# Patient Record
Sex: Male | Born: 1940
Health system: Southern US, Community
[De-identification: ages and names within clinical notes are randomized; demographics above are authoritative.]

## PROBLEM LIST (undated history)

## (undated) DIAGNOSIS — E039 Hypothyroidism, unspecified: Secondary | ICD-10-CM

## (undated) DIAGNOSIS — E785 Hyperlipidemia, unspecified: Secondary | ICD-10-CM

## (undated) DIAGNOSIS — Z860100 Personal history of colon polyps, unspecified: Secondary | ICD-10-CM

## (undated) DIAGNOSIS — D649 Anemia, unspecified: Secondary | ICD-10-CM

## (undated) DIAGNOSIS — C8510 Unspecified B-cell lymphoma, unspecified site: Secondary | ICD-10-CM

## (undated) DIAGNOSIS — N390 Urinary tract infection, site not specified: Secondary | ICD-10-CM

## (undated) DIAGNOSIS — M1990 Unspecified osteoarthritis, unspecified site: Secondary | ICD-10-CM

## (undated) DIAGNOSIS — M1712 Unilateral primary osteoarthritis, left knee: Secondary | ICD-10-CM

## (undated) DIAGNOSIS — M810 Age-related osteoporosis without current pathological fracture: Secondary | ICD-10-CM

## (undated) DIAGNOSIS — H548 Legal blindness, as defined in USA: Secondary | ICD-10-CM

## (undated) DIAGNOSIS — J45909 Unspecified asthma, uncomplicated: Secondary | ICD-10-CM

## (undated) DIAGNOSIS — T7840XA Allergy, unspecified, initial encounter: Secondary | ICD-10-CM

## (undated) DIAGNOSIS — J439 Emphysema, unspecified: Secondary | ICD-10-CM

## (undated) DIAGNOSIS — F419 Anxiety disorder, unspecified: Secondary | ICD-10-CM

## (undated) HISTORY — DX: Legal blindness, as defined in USA: H54.8

## (undated) HISTORY — DX: Urinary tract infection, site not specified: N39.0

## (undated) HISTORY — DX: Unspecified osteoarthritis, unspecified site: M19.90

## (undated) HISTORY — DX: Personal history of colon polyps, unspecified: Z86.0100

## (undated) HISTORY — DX: Personal history of colonic polyps: Z86.010

## (undated) HISTORY — DX: Allergy, unspecified, initial encounter: T78.40XA

## (undated) HISTORY — PX: EYE SURGERY: SHX253

## (undated) HISTORY — DX: Age-related osteoporosis without current pathological fracture: M81.0

## (undated) HISTORY — DX: Anemia, unspecified: D64.9

## (undated) HISTORY — DX: Unspecified asthma, uncomplicated: J45.909

## (undated) HISTORY — DX: Hyperlipidemia, unspecified: E78.5

## (undated) HISTORY — DX: Unspecified B-cell lymphoma, unspecified site: C85.10

## (undated) HISTORY — DX: Emphysema, unspecified: J43.9

---
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
## 1980-05-29 HISTORY — PX: EYE SURGERY: SHX253

## 1997-05-29 HISTORY — PX: COLON SURGERY: SHX602

## 1997-05-29 HISTORY — PX: APPENDECTOMY: SHX54

## 2003-05-30 DIAGNOSIS — C8510 Unspecified B-cell lymphoma, unspecified site: Secondary | ICD-10-CM

## 2003-05-30 HISTORY — DX: Unspecified B-cell lymphoma, unspecified site: C85.10

## 2007-05-30 HISTORY — PX: PROSTATE ABLATION: SHX6042

## 2008-12-27 HISTORY — PX: BRAIN SURGERY: SHX531

## 2009-05-29 HISTORY — PX: KNEE ARTHROSCOPY: SUR90

## 2009-06-24 ENCOUNTER — Encounter: Admission: RE | Admit: 2009-06-24 | Discharge: 2009-07-27 | Payer: Self-pay | Admitting: Orthopedic Surgery

## 2012-11-02 DIAGNOSIS — J45909 Unspecified asthma, uncomplicated: Secondary | ICD-10-CM | POA: Insufficient documentation

## 2012-11-02 DIAGNOSIS — E7800 Pure hypercholesterolemia, unspecified: Secondary | ICD-10-CM | POA: Insufficient documentation

## 2014-08-14 ENCOUNTER — Other Ambulatory Visit (HOSPITAL_COMMUNITY): Payer: Self-pay | Admitting: Urology

## 2014-08-14 DIAGNOSIS — N2889 Other specified disorders of kidney and ureter: Secondary | ICD-10-CM

## 2014-08-18 ENCOUNTER — Ambulatory Visit (HOSPITAL_COMMUNITY)
Admission: RE | Admit: 2014-08-18 | Discharge: 2014-08-18 | Disposition: A | Discharge status: Home / Self Care | Payer: Medicare HMO | Source: Ambulatory Visit | Attending: Urology | Admitting: Urology

## 2014-08-18 DIAGNOSIS — N400 Enlarged prostate without lower urinary tract symptoms: Secondary | ICD-10-CM | POA: Diagnosis not present

## 2014-08-18 DIAGNOSIS — N2889 Other specified disorders of kidney and ureter: Secondary | ICD-10-CM

## 2015-08-25 DIAGNOSIS — Z800 Family history of malignant neoplasm of digestive organs: Secondary | ICD-10-CM | POA: Insufficient documentation

## 2015-09-01 LAB — HM COLONOSCOPY

## 2015-09-03 DIAGNOSIS — C8510 Unspecified B-cell lymphoma, unspecified site: Secondary | ICD-10-CM | POA: Insufficient documentation

## 2016-08-17 ENCOUNTER — Other Ambulatory Visit: Payer: Self-pay | Admitting: Urology

## 2016-08-17 DIAGNOSIS — R972 Elevated prostate specific antigen [PSA]: Secondary | ICD-10-CM

## 2016-09-01 ENCOUNTER — Ambulatory Visit
Admission: RE | Admit: 2016-09-01 | Discharge: 2016-09-01 | Disposition: A | Discharge status: Home / Self Care | Payer: Medicare HMO | Source: Ambulatory Visit | Attending: Urology | Admitting: Urology

## 2016-09-01 DIAGNOSIS — R972 Elevated prostate specific antigen [PSA]: Secondary | ICD-10-CM

## 2016-09-01 MED ORDER — GADOBENATE DIMEGLUMINE 529 MG/ML IV SOLN
15.0000 mL | Freq: Once | INTRAVENOUS | Status: AC | PRN
  Administered 2016-09-01: 14 mL via INTRAVENOUS

## 2016-10-27 LAB — PSA: PSA: 3.21

## 2017-03-30 LAB — PSA: PSA: 4.23

## 2017-09-21 IMAGING — MR MR PROSTATE WO/W CM
56 series · 56 of 56 positions shown · IV contrast (Multihance 14ml)
Comparison: None.

CLINICAL DATA: Elevated PSA level. Previous negative prostate
biopsy.

Creatinine was obtained on site at [HOSPITAL] at [HOSPITAL].Results: Creatinine 1.1 mg/dL.
EXAM:
MR PROSTATE WITHOUT AND WITH CONTRAST
TECHNIQUE: Multiplanar multisequence MRI images were obtained of the pelvis
centered about the prostate. Pre and post contrast images were
obtained.
CONTRAST:  14mL MULTIHANCE GADOBENATE DIMEGLUMINE 529 MG/ML IV SOLN

[Series 3: T1 · axial · 8.0mm · 1.06mm/px · 1 of 28 slices shown (1 of 2)]
[im 1/28]
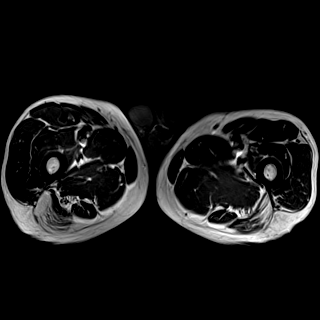

[Series 4: bSSFP fat-sat · axial · 8.0mm · 0.74mm/px · 1 of 28 slices shown]
[im 1/28]
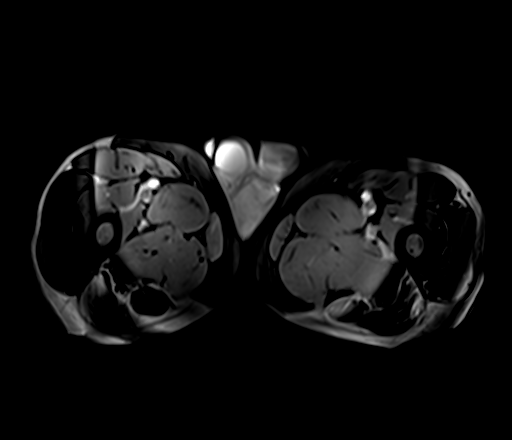

[Series 5: T2 · sagittal · 3.5mm · 0.56mm/px · 1 of 40 slices shown (1 of 4)]
[im 1/40]
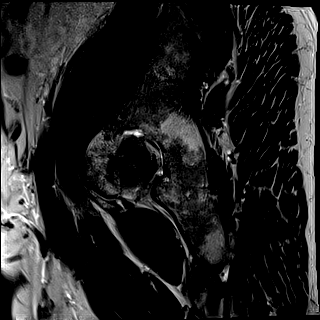

[Series 6: T1 · axial · 3.0mm · 0.31mm/px · 1 of 24 slices shown (2 of 2)]
[im 1/24]
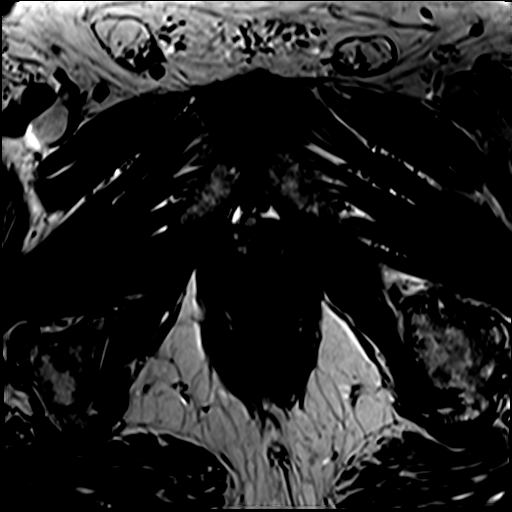

[Series 7: T2 · axial · 3.5mm · 0.56mm/px · 1 of 23 slices shown (2 of 4)]
[im 1/23]
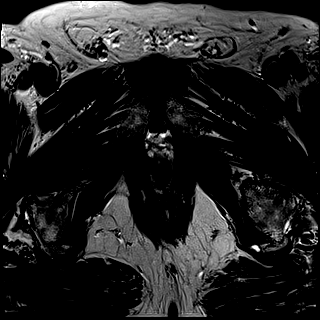

[Series 8: T2 · axial · 1.0mm · 1.04mm/px · 1 of 80 slices shown (3 of 4)]
[im 1/80]
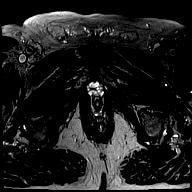

[Series 9: T2 · coronal · 3.5mm · 0.56mm/px · 1 of 24 slices shown (4 of 4)]
[im 1/24]
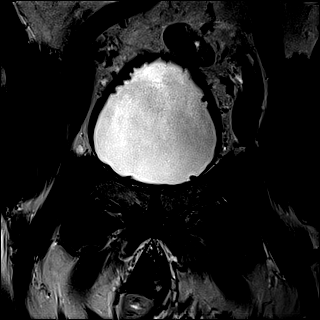

[Series 10: DWI · axial · 3.5mm · 1.56mm/px · 1 of 60 slices shown (1 of 2)]
[im 1/60]
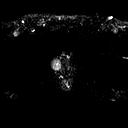

[Series 11: DWI · axial · 3.5mm · 1.56mm/px · 1 of 20 slices shown (2 of 2)]
[im 1/20]
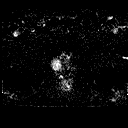

[Series 12: t1_twist_tra_dyn_ttc=5.8s · axial · 3.5mm · 0.83mm/px · 1 of 22 slices shown]
[im 1/22]
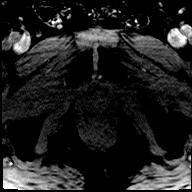

[Series 13: t1_twist_tra_dyn-copy center to · axial · 3.5mm · 0.83mm/px · 1 of 22 slices shown (1 of 24)]
[im 1/22]
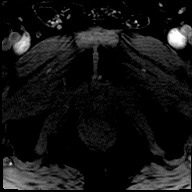

[Series 14: t1_twist_tra_dyn-copy center to · axial · 3.5mm · 0.83mm/px · 1 of 22 slices shown (2 of 24)]
[im 1/22]
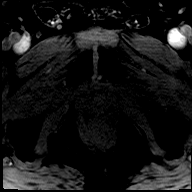

[Series 15: t1_twist_tra_dyn-copy center to_sub_ttc=25.7s · axial · 3.5mm · 0.83mm/px · 1 of 22 slices shown]
[im 1/22]
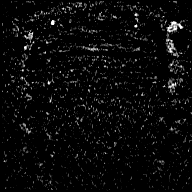

[Series 16: t1_twist_tra_dyn-copy center to · axial · 3.5mm · 0.83mm/px · 1 of 22 slices shown (3 of 24)]
[im 1/22]
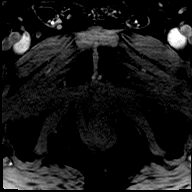

[Series 17: t1_twist_tra_dyn-copy center to_sub_ttc=37.2s · axial · 3.5mm · 0.83mm/px · 1 of 22 slices shown]
[im 1/22]
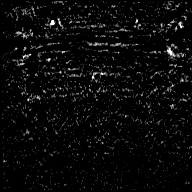

[Series 18: t1_twist_tra_dyn-copy center to · axial · 3.5mm · 0.83mm/px · 1 of 22 slices shown (4 of 24)]
[im 1/22]
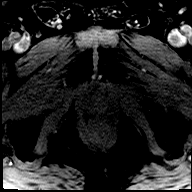

[Series 19: t1_twist_tra_dyn-copy center to_sub_ttc=48.6s · axial · 3.5mm · 0.83mm/px · 1 of 22 slices shown]
[im 1/22]
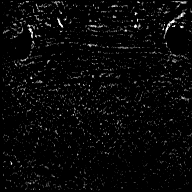

[Series 20: t1_twist_tra_dyn-copy center to · axial · 3.5mm · 0.83mm/px · 1 of 22 slices shown (5 of 24)]
[im 1/22]
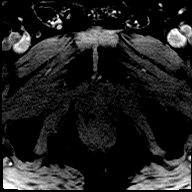

[Series 21: t1_twist_tra_dyn-copy center to_sub_ttc=60.1s · axial · 3.5mm · 0.83mm/px · 1 of 22 slices shown]
[im 1/22]
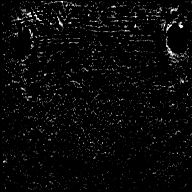

[Series 22: t1_twist_tra_dyn-copy center to · axial · 3.5mm · 0.83mm/px · 1 of 22 slices shown (6 of 24)]
[im 1/22]
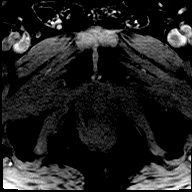

[Series 23: t1_twist_tra_dyn-copy center to_sub_ttc=71.6s · axial · 3.5mm · 0.83mm/px · 1 of 22 slices shown]
[im 1/22]
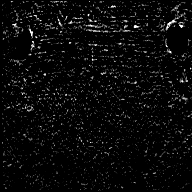

[Series 24: t1_twist_tra_dyn-copy center to · axial · 3.5mm · 0.83mm/px · 1 of 22 slices shown (7 of 24)]
[im 1/22]
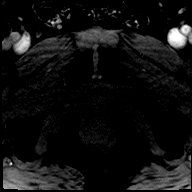

[Series 25: t1_twist_tra_dyn-copy center to_sub_ttc=83.1s · axial · 3.5mm · 0.83mm/px · 1 of 22 slices shown]
[im 1/22]
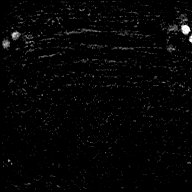

[Series 26: t1_twist_tra_dyn-copy center to · axial · 3.5mm · 0.83mm/px · 1 of 22 slices shown (8 of 24)]
[im 1/22]
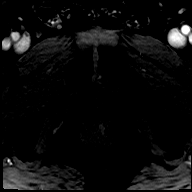

[Series 27: t1_twist_tra_dyn-copy center to_sub_ttc=94.5s · axial · 3.5mm · 0.83mm/px · 1 of 22 slices shown]
[im 1/22]
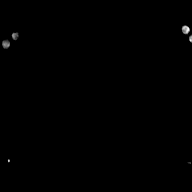

[Series 28: t1_twist_tra_dyn-copy center to · axial · 3.5mm · 0.83mm/px · 1 of 22 slices shown (9 of 24)]
[im 1/22]
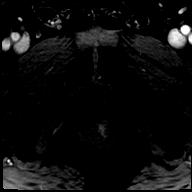

[Series 29: t1_twist_tra_dyn-copy center to_sub_ttc=106.0s · axial · 3.5mm · 0.83mm/px · 1 of 22 slices shown]
[im 1/22]
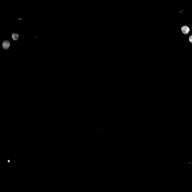

[Series 30: t1_twist_tra_dyn-copy center to · axial · 3.5mm · 0.83mm/px · 1 of 22 slices shown (10 of 24)]
[im 1/22]
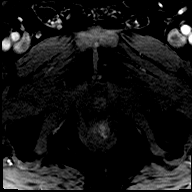

[Series 31: t1_twist_tra_dyn-copy center to_sub_ttc=117.5s · axial · 3.5mm · 0.83mm/px · 1 of 22 slices shown]
[im 1/22]
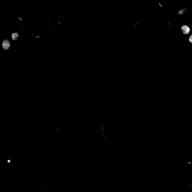

[Series 32: t1_twist_tra_dyn-copy center to · axial · 3.5mm · 0.83mm/px · 1 of 22 slices shown (11 of 24)]
[im 1/22]
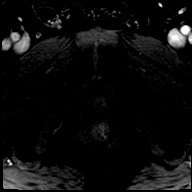

[Series 33: t1_twist_tra_dyn-copy center to_sub_ttc=128.9s · axial · 3.5mm · 0.83mm/px · 1 of 22 slices shown]
[im 1/22]
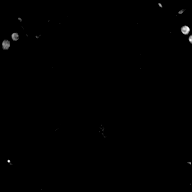

[Series 34: t1_twist_tra_dyn-copy center to · axial · 3.5mm · 0.83mm/px · 1 of 22 slices shown (12 of 24)]
[im 1/22]
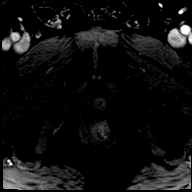

[Series 35: t1_twist_tra_dyn-copy center to_sub_ttc=140.4s · axial · 3.5mm · 0.83mm/px · 1 of 22 slices shown]
[im 1/22]
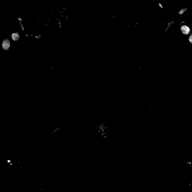

[Series 36: t1_twist_tra_dyn-copy center to · axial · 3.5mm · 0.83mm/px · 1 of 22 slices shown (13 of 24)]
[im 1/22]
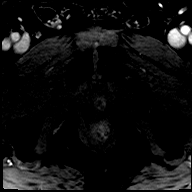

[Series 37: t1_twist_tra_dyn-copy center to_sub_ttc=151.9s · axial · 3.5mm · 0.83mm/px · 1 of 22 slices shown]
[im 1/22]
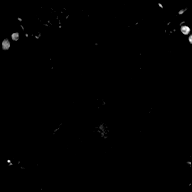

[Series 38: t1_twist_tra_dyn-copy center to · axial · 3.5mm · 0.83mm/px · 1 of 22 slices shown (14 of 24)]
[im 1/22]
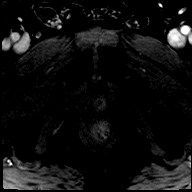

[Series 39: t1_twist_tra_dyn-copy center to_sub_ttc=163.3s · axial · 3.5mm · 0.83mm/px · 1 of 22 slices shown]
[im 1/22]
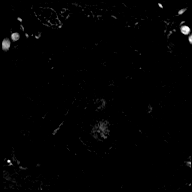

[Series 40: t1_twist_tra_dyn-copy center to · axial · 3.5mm · 0.83mm/px · 1 of 22 slices shown (15 of 24)]
[im 1/22]
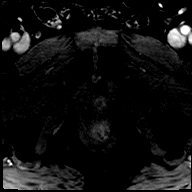

[Series 41: t1_twist_tra_dyn-copy center to_sub_ttc=174.8s · axial · 3.5mm · 0.83mm/px · 1 of 22 slices shown]
[im 1/22]
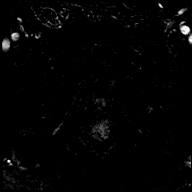

[Series 42: t1_twist_tra_dyn-copy center to · axial · 3.5mm · 0.83mm/px · 1 of 22 slices shown (16 of 24)]
[im 1/22]
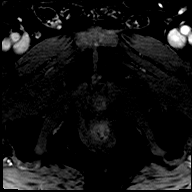

[Series 43: t1_twist_tra_dyn-copy center to_sub_ttc=186.3s · axial · 3.5mm · 0.83mm/px · 1 of 22 slices shown]
[im 1/22]
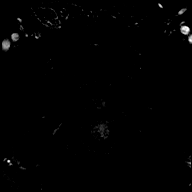

[Series 44: t1_twist_tra_dyn-copy center to · axial · 3.5mm · 0.83mm/px · 1 of 22 slices shown (17 of 24)]
[im 1/22]
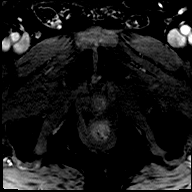

[Series 45: t1_twist_tra_dyn-copy center to_sub_ttc=197.7s · axial · 3.5mm · 0.83mm/px · 1 of 22 slices shown]
[im 1/22]
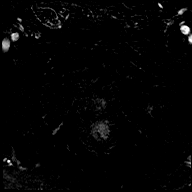

[Series 46: t1_twist_tra_dyn-copy center to · axial · 3.5mm · 0.83mm/px · 1 of 22 slices shown (18 of 24)]
[im 1/22]
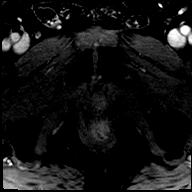

[Series 47: t1_twist_tra_dyn-copy center to_sub_ttc=209.2s · axial · 3.5mm · 0.83mm/px · 1 of 22 slices shown]
[im 1/22]
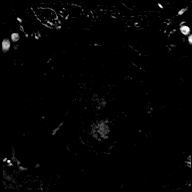

[Series 48: t1_twist_tra_dyn-copy center to · axial · 3.5mm · 0.83mm/px · 1 of 22 slices shown (19 of 24)]
[im 1/22]
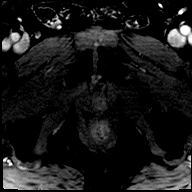

[Series 49: t1_twist_tra_dyn-copy center to_sub_ttc=220.7s · axial · 3.5mm · 0.83mm/px · 1 of 22 slices shown]
[im 1/22]
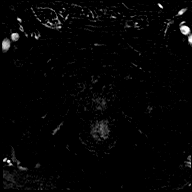

[Series 50: t1_twist_tra_dyn-copy center to · axial · 3.5mm · 0.83mm/px · 1 of 22 slices shown (20 of 24)]
[im 1/22]
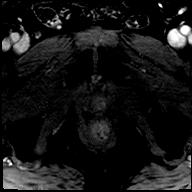

[Series 51: t1_twist_tra_dyn-copy center to_sub_ttc=232.2s · axial · 3.5mm · 0.83mm/px · 1 of 22 slices shown]
[im 1/22]
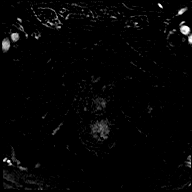

[Series 52: t1_twist_tra_dyn-copy center to · axial · 3.5mm · 0.83mm/px · 1 of 22 slices shown (21 of 24)]
[im 1/22]
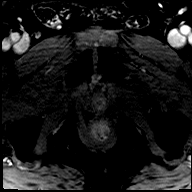

[Series 53: t1_twist_tra_dyn-copy center to_sub_ttc=243.6s · axial · 3.5mm · 0.83mm/px · 1 of 22 slices shown]
[im 1/22]
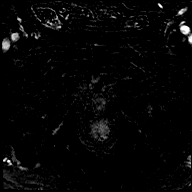

[Series 54: t1_twist_tra_dyn-copy center to · axial · 3.5mm · 0.83mm/px · 1 of 22 slices shown (22 of 24)]
[im 1/22]
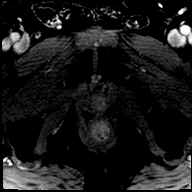

[Series 55: t1_twist_tra_dyn-copy center to_sub_ttc=255.1s · axial · 3.5mm · 0.83mm/px · 1 of 22 slices shown]
[im 1/22]
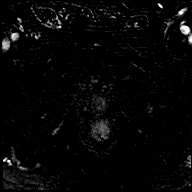

[Series 56: t1_twist_tra_dyn-copy center to · axial · 3.5mm · 0.83mm/px · 1 of 22 slices shown (23 of 24)]
[im 1/22]
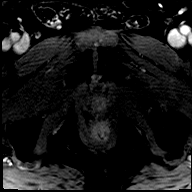

[Series 57: t1_twist_tra_dyn-copy center to_sub_ttc=266.6s · axial · 3.5mm · 0.83mm/px · 1 of 22 slices shown]
[im 1/22]
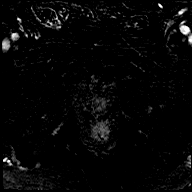

[Series 58: t1_twist_tra_dyn-copy center to · axial · 3.5mm · 0.83mm/px · 1 of 22 slices shown (24 of 24)]
[im 1/22]
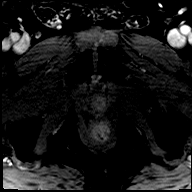

[56 of 56 positions shown; findings below may reference images not displayed]

FINDINGS: Prostate: Transition zone hypertrophy is seen with multiple BPH
nodules. There is mass effect on the bladder base. No suspicious T2
hypointense transition zone nodules are identified.

The peripheral zone shows diffuse thinning. Extruded BPH nodules are
seen in the right anterior apex and right posterolateral mid gland.
No focal areas of ADC hypointensity or DWI hyperintensity are seen
within the peripheral zone. No focal sites of early enhancement in
peripheral zone on dynamic imaging.

Volume: 7.5 x 6.4 x 7.9 cm (volume = 200 cm^3)

Transcapsular spread:  Absent

Seminal vesicle involvement: Absent

Neurovascular bundle involvement: Absent

Pelvic adenopathy: Absent

Bone metastasis: Absent

Other findings: Mild diffuse urinary bladder wall thickening,
consistent with chronic bladder outlet obstruction. A small septated
cystic lesion is seen adjacent to the right external iliac vessels
and right iliopsoas muscle which measures 2.5 x 1.8 cm on image [DATE].
This shows no evidence of contrast enhancement, and is consistent
with a benign lymphangioma, lymphocele, or seroma.
IMPRESSION: No radiographic evidence of high-grade prostate carcinoma. PI-RADS
1: Very Low (clinically significant cancer is highly unlikely to be
present)

## 2017-11-09 ENCOUNTER — Telehealth: Payer: Self-pay

## 2017-11-09 NOTE — Telephone Encounter (Signed)
Please schedule at his earliest convenience

## 2017-11-09 NOTE — Telephone Encounter (Signed)
Called pt and LVM regarding their request to establish care with Dr. Larose Kells. Informed him that his request was approved and to call and schedule a NP appt.

## 2017-11-09 NOTE — Telephone Encounter (Signed)
-----   Message from [place] sent at 11/09/2017  9:02 AM EDT ----- Regarding: New Pt to est Contact: [phone] Pt would like to know if possible to be established as a new pt, Pt was recommended by Ananias Pilgrim (a pt that already sees Dr Larose Kells) Please Advise.

## 2017-11-12 NOTE — Telephone Encounter (Signed)
Appt scheduled 12/14/2017.

## 2017-12-14 ENCOUNTER — Encounter: Payer: Self-pay | Admitting: Internal Medicine

## 2017-12-14 ENCOUNTER — Encounter (INDEPENDENT_AMBULATORY_CARE_PROVIDER_SITE_OTHER): Payer: Self-pay

## 2017-12-14 ENCOUNTER — Ambulatory Visit (INDEPENDENT_AMBULATORY_CARE_PROVIDER_SITE_OTHER): Payer: Medicare HMO | Admitting: Internal Medicine

## 2017-12-14 VITALS — BP 124/68 | HR 77 | Temp 97.7°F | Resp 16 | Ht 73.0 in | Wt 162.1 lb

## 2017-12-14 DIAGNOSIS — J4520 Mild intermittent asthma, uncomplicated: Secondary | ICD-10-CM | POA: Diagnosis not present

## 2017-12-14 DIAGNOSIS — E7800 Pure hypercholesterolemia, unspecified: Secondary | ICD-10-CM

## 2017-12-14 DIAGNOSIS — N400 Enlarged prostate without lower urinary tract symptoms: Secondary | ICD-10-CM

## 2017-12-14 DIAGNOSIS — E079 Disorder of thyroid, unspecified: Secondary | ICD-10-CM

## 2017-12-14 DIAGNOSIS — M150 Primary generalized (osteo)arthritis: Secondary | ICD-10-CM

## 2017-12-14 DIAGNOSIS — M159 Polyosteoarthritis, unspecified: Secondary | ICD-10-CM

## 2017-12-14 DIAGNOSIS — R972 Elevated prostate specific antigen [PSA]: Secondary | ICD-10-CM

## 2017-12-14 NOTE — Progress Notes (Signed)
Subjective:    Patient ID: Kyle Wise, male    DOB: [birth year], 77 y.o.   MRN: [id number]  DOS:  12/14/2017 Type of visit - description : New patient, referred by Bennie Dallas. Interval history:  New patient, no major concerns reported today. The patient brought information for my review. Also reviewed  available information in two electronic medical record systems  Review of Systems  Denies chest pain or difficulty breathing No nausea, vomiting, diarrhea.  No blood in the stools. LUTS relatively well controlled as long as he takes tamsulosin.  Still has nocturia few times every night. Asthma not an issue at this point, no cough, wheezing.  Past Medical History:  Diagnosis Date  . Allergy    seasonal  . Anemia   . Arthritis   . Asthma   . Emphysema of lung ([place])   . Frequent UTI   . History of colon polyps   . Hyperlipidemia   . Legal blindness   . 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 Osteoporosis     Past Surgical History:  Procedure Laterality Date  . APPENDECTOMY  1999  . BRAIN SURGERY  12/2008   meningioma   . COLON SURGERY  1999   colon resection  . EYE SURGERY     cataract  . KNEE ARTHROSCOPY Left 05/2009  . PROSTATE ABLATION  2009   "microwave procedure", Dr Hazle Nordmann  . TONSILLECTOMY AND ADENOIDECTOMY  1945    Social History   Socioeconomic History  . Marital status: Married    Spouse name: Not on file  . Number of children: 2  . Years of education: Not on file  . Highest education level: Not on file  Occupational History  . Occupation: retiredProgramme researcher, broadcasting/film/video for [employer]  Social Needs  . Financial resource strain: Not on file  . Food insecurity:    Worry: Not on file    Inability: Not on file  . Transportation needs:    Medical: Not on file    Non-medical: Not on file  Tobacco Use  . Smoking status: Never Smoker  . Smokeless tobacco: Never Used  Substance and Sexual Activity  . Alcohol use: Yes    Comment: socially  . Drug use: Never  .  Sexual activity: Not on file  Lifestyle  . Physical activity:    Days per week: Not on file    Minutes per session: Not on file  . Stress: Not on file  Relationships  . Social connections:    Talks on phone: Not on file    Gets together: Not on file    Attends religious service: Not on file    Active member of club or organization: Not on file    Attends meetings of clubs or organizations: Not on file    Relationship status: Not on file  . Intimate partner violence:    Fear of current or ex partner: Not on file    Emotionally abused: Not on file    Physically abused: Not on file    Forced sexual activity: Not on file  Other Topics Concern  . Not on file  Social History Narrative   Household: Pt and wife     Family History  Problem Relation Age of Onset  . CAD Mother        MI age 61  . Arthritis Mother   . Heart attack Mother   . Cancer Mother   . Colon cancer Father   . Arthritis  Father   . Diabetes Maternal Grandmother   . CAD Maternal Grandfather   . Heart attack Maternal Grandfather   . Asthma Brother   . Arthritis Brother   . Cancer Paternal Grandmother   . Prostate cancer Neg Hx      Allergies as of 12/14/2017   No Known Allergies     Medication List        Accurate as of 12/14/17 11:59 PM. Always use your most recent med list.          aspirin EC 81 MG tablet Take 81 mg by mouth daily.   liothyronine 5 MCG tablet Commonly known as:  CYTOMEL Take 5 mcg by mouth daily.   Magnesium 200 MG Tabs Take 200 mg by mouth 2 (two) times daily.   Melatonin 1 MG Caps Take 1 mg by mouth daily.   multivitamin with minerals Tabs tablet Take 1 tablet by mouth daily.   NALTREXONE HCL PO Take 4.5 mg by mouth daily.   tamsulosin 0.4 MG Caps capsule Commonly known as:  FLOMAX Take 0.4 mg by mouth 2 (two) times daily.   thyroid 60 MG tablet Commonly known as:  ARMOUR Take 60 mg by mouth daily.          Objective:   Physical Exam BP 124/68 (BP  Location: Left Arm, Patient Position: Sitting, Cuff Size: Small)   Pulse 77   Temp 97.7 F (36.5 C) (Oral)   Resp 16   Ht 6\' 1"  (1.854 m)   Wt 162 lb 2 oz (73.5 kg)   SpO2 98%   BMI 21.39 kg/m  General: Well developed, NAD, see BMI.  Neck: No  thyromegaly  HEENT:  Normocephalic . Face symmetric, atraumatic Lungs:  Decreased breath sounds Normal respiratory effort, no intercostal retractions, no accessory muscle use. Heart: RRR,  no murmur.  No pretibial edema bilaterally  Abdomen:  Not distended, soft, non-tender. No rebound or rigidity.   Skin: Exposed areas without rash. Not pale. Not jaundice Neurologic:  alert & oriented X3.  Speech normal, gait appropriate for age and unassisted Strength symmetric and appropriate for age.  Psych: Cognition and judgment appear intact.  Cooperative with normal attention span and concentration.  Behavior appropriate. No anxious or depressed appearing.     Assessment & Plan:   Assessment Hyperglycemia High cholesterol Thyroid disease: on citomel/armour rx by   Kirk Ruths NP Asthma DJD, history of knee arthroscopy 2011 Low-grade B-cell lymphoma, hematology [facility] Elevated PSA, BPH, LUTS.  Prostate MRI 08/2016 with no evidence of high-grade prostate cancer. Dr Alinda Money Legally blind due to meningioma surgery Colon polyp: s/p partial colectomy 1999, Dr Dorrene German Naltroxen: rx by Kirk Ruths NP [place] NP (holistic NP)  PLAN:  Labs from 07/17/2017: A1c 5.7.  Uric acid 8.7. CMP: Creatinine 1.1.  Albumin 5.0, slightly low. Ferritin normal 120. Total cholesterol 231, LDL 173, HDL 42.  Triglycerides 81 C-reactive protein: 3.4, slightly high. TSH 0.9, free T3 reverse T3 30, slightly elevated; T4 normal Vitamin D normal CBC platelets 149, hemoglobin 17.5  High cholesterol: Diet controlled, recent LDL 173, recheck on RTC Asthma: Not an issue at this time Thyroid disease: On Cytomel and Armour.  Unclear reason to take Cytomel.  Pt declined   endocrinology referral. DJD: Mostly at the knee, conservative treatment for now Lymphoma: Follow-up @ [facility]. BPH: Sees Dr. Alinda Money, will get records Naltrexone: Prescribed for cancer prevention, denies issues with abuse.  Prescribed elsewhere, pt aware I won't RF meds this  meds Off-label use of meds: pt aware I won't RF RTC fasting 4 months  Today, I spent more than  38  min with the patient: >50% of the time counseling regards naltrexene and off-label meds use and reviewing all available information in 2 EMR systems

## 2017-12-14 NOTE — Progress Notes (Signed)
Pre visit review using our clinic review tool, if applicable. No additional management support is needed unless otherwise documented below in the visit note. 

## 2017-12-14 NOTE — Patient Instructions (Addendum)
  GO TO THE FRONT DESK Schedule your next appointment for a checkup in 6 months, fasting

## 2017-12-15 DIAGNOSIS — R972 Elevated prostate specific antigen [PSA]: Secondary | ICD-10-CM

## 2017-12-15 DIAGNOSIS — E079 Disorder of thyroid, unspecified: Secondary | ICD-10-CM | POA: Insufficient documentation

## 2017-12-15 DIAGNOSIS — N400 Enlarged prostate without lower urinary tract symptoms: Secondary | ICD-10-CM | POA: Insufficient documentation

## 2017-12-15 DIAGNOSIS — M1990 Unspecified osteoarthritis, unspecified site: Secondary | ICD-10-CM | POA: Insufficient documentation

## 2017-12-17 LAB — BASIC METABOLIC PANEL
BUN: 15 (ref 4–21)
CREATININE: 1.1 (ref 0.6–1.3)
Glucose: 101
Potassium: 4.6 (ref 3.4–5.3)
Sodium: 143 (ref 137–147)

## 2017-12-17 LAB — HEPATIC FUNCTION PANEL
ALT: 23 (ref 10–40)
AST: 19 (ref 14–40)
Alkaline Phosphatase: 68 (ref 25–125)
Bilirubin, Total: 0.7

## 2017-12-17 LAB — LIPID PANEL
CHOLESTEROL: 204 — AB (ref 0–200)
HDL: 37 (ref 35–70)
LDL Cholesterol: 129
Triglycerides: 188 — AB (ref 40–160)

## 2017-12-17 LAB — CBC AND DIFFERENTIAL
HCT: 49 (ref 41–53)
Hemoglobin: 16.5 (ref 13.5–17.5)
Neutrophils Absolute: 4
Platelets: 140 — AB (ref 150–399)
WBC: 6

## 2017-12-17 LAB — TSH: TSH: 0.02 — AB (ref 0.41–5.90)

## 2017-12-17 LAB — HEMOGLOBIN A1C: HEMOGLOBIN A1C: 5.9

## 2017-12-18 ENCOUNTER — Telehealth: Payer: Self-pay | Admitting: *Deleted

## 2017-12-18 NOTE — Telephone Encounter (Signed)
Received Medical records from [facility]; forwarded to provider/SLS 07/23

## 2017-12-20 ENCOUNTER — Encounter: Payer: Self-pay | Admitting: Internal Medicine

## 2017-12-27 ENCOUNTER — Telehealth: Payer: Self-pay | Admitting: Internal Medicine

## 2017-12-27 ENCOUNTER — Encounter: Payer: Self-pay | Admitting: Internal Medicine

## 2017-12-27 NOTE — Telephone Encounter (Signed)
Received records from [facility] urology, multiple pages.   Urological diagnosis: --BPH, LUTS --Ureteral stricture.  Status post multiple dilatations before by previous urologist Dr. Burnell Blanks --TUMT September 2006 Dr. Burnell Blanks --Recurrent UTIs due to Citrobacter despite appropriate antibiotics. --Asymptomatic bacteriuria, no treatment unless symptoms --Elevated PSA, range between 3 and 7, prostate biopsy 2005, + basal cell hyperplasia without malignancy -- prostate MRI April 2018, no concerning lesions  Last visit 04/13/2017, at the time symptoms were controlled with tamsulosin 0.8 mg.

## 2017-12-31 DIAGNOSIS — E790 Hyperuricemia without signs of inflammatory arthritis and tophaceous disease: Secondary | ICD-10-CM | POA: Diagnosis not present

## 2017-12-31 DIAGNOSIS — E039 Hypothyroidism, unspecified: Secondary | ICD-10-CM | POA: Diagnosis not present

## 2017-12-31 DIAGNOSIS — E785 Hyperlipidemia, unspecified: Secondary | ICD-10-CM | POA: Diagnosis not present

## 2017-12-31 DIAGNOSIS — R7303 Prediabetes: Secondary | ICD-10-CM | POA: Diagnosis not present

## 2018-02-22 DIAGNOSIS — E039 Hypothyroidism, unspecified: Secondary | ICD-10-CM | POA: Diagnosis not present

## 2018-02-22 DIAGNOSIS — E785 Hyperlipidemia, unspecified: Secondary | ICD-10-CM | POA: Diagnosis not present

## 2018-02-22 DIAGNOSIS — Z9842 Cataract extraction status, left eye: Secondary | ICD-10-CM | POA: Diagnosis not present

## 2018-02-22 DIAGNOSIS — N400 Enlarged prostate without lower urinary tract symptoms: Secondary | ICD-10-CM | POA: Diagnosis not present

## 2018-02-22 DIAGNOSIS — Z6823 Body mass index (BMI) 23.0-23.9, adult: Secondary | ICD-10-CM | POA: Diagnosis not present

## 2018-02-22 DIAGNOSIS — H548 Legal blindness, as defined in USA: Secondary | ICD-10-CM | POA: Diagnosis not present

## 2018-02-22 DIAGNOSIS — C8590 Non-Hodgkin lymphoma, unspecified, unspecified site: Secondary | ICD-10-CM | POA: Diagnosis not present

## 2018-02-22 DIAGNOSIS — Z9841 Cataract extraction status, right eye: Secondary | ICD-10-CM | POA: Diagnosis not present

## 2018-02-22 DIAGNOSIS — G63 Polyneuropathy in diseases classified elsewhere: Secondary | ICD-10-CM | POA: Diagnosis not present

## 2018-03-21 ENCOUNTER — Encounter: Payer: Self-pay | Admitting: Internal Medicine

## 2018-04-03 DIAGNOSIS — E039 Hypothyroidism, unspecified: Secondary | ICD-10-CM | POA: Diagnosis not present

## 2018-04-03 DIAGNOSIS — R799 Abnormal finding of blood chemistry, unspecified: Secondary | ICD-10-CM | POA: Diagnosis not present

## 2018-04-03 DIAGNOSIS — R5382 Chronic fatigue, unspecified: Secondary | ICD-10-CM | POA: Diagnosis not present

## 2018-04-03 DIAGNOSIS — E569 Vitamin deficiency, unspecified: Secondary | ICD-10-CM | POA: Diagnosis not present

## 2018-04-03 DIAGNOSIS — D510 Vitamin B12 deficiency anemia due to intrinsic factor deficiency: Secondary | ICD-10-CM | POA: Diagnosis not present

## 2018-04-03 DIAGNOSIS — E559 Vitamin D deficiency, unspecified: Secondary | ICD-10-CM | POA: Diagnosis not present

## 2018-04-12 DIAGNOSIS — R972 Elevated prostate specific antigen [PSA]: Secondary | ICD-10-CM | POA: Diagnosis not present

## 2018-04-12 LAB — PSA: PSA: 3.94

## 2018-04-19 DIAGNOSIS — R3912 Poor urinary stream: Secondary | ICD-10-CM | POA: Diagnosis not present

## 2018-04-19 DIAGNOSIS — R972 Elevated prostate specific antigen [PSA]: Secondary | ICD-10-CM | POA: Diagnosis not present

## 2018-04-19 DIAGNOSIS — N401 Enlarged prostate with lower urinary tract symptoms: Secondary | ICD-10-CM | POA: Diagnosis not present

## 2018-04-19 DIAGNOSIS — R8271 Bacteriuria: Secondary | ICD-10-CM | POA: Diagnosis not present

## 2018-05-07 ENCOUNTER — Encounter: Payer: Self-pay | Admitting: Internal Medicine

## 2018-06-14 DIAGNOSIS — C8510 Unspecified B-cell lymphoma, unspecified site: Secondary | ICD-10-CM | POA: Diagnosis not present

## 2018-06-17 ENCOUNTER — Ambulatory Visit (INDEPENDENT_AMBULATORY_CARE_PROVIDER_SITE_OTHER): Payer: Medicare HMO | Admitting: Internal Medicine

## 2018-06-17 ENCOUNTER — Encounter: Payer: Self-pay | Admitting: Internal Medicine

## 2018-06-17 VITALS — BP 120/74 | HR 49 | Temp 98.0°F | Resp 16 | Ht 68.0 in | Wt 162.1 lb

## 2018-06-17 DIAGNOSIS — E7800 Pure hypercholesterolemia, unspecified: Secondary | ICD-10-CM

## 2018-06-17 DIAGNOSIS — Z23 Encounter for immunization: Secondary | ICD-10-CM

## 2018-06-17 DIAGNOSIS — E079 Disorder of thyroid, unspecified: Secondary | ICD-10-CM

## 2018-06-17 LAB — LIPID PANEL
Cholesterol: 172 mg/dL (ref 0–200)
HDL: 39.8 mg/dL (ref >39.00)
LDL Cholesterol: 111 mg/dL — ABNORMAL HIGH (ref 0–99)
NONHDL: 132.42
Total CHOL/HDL Ratio: 4
Triglycerides: 108 mg/dL (ref 0.0–149.0)
VLDL: 21.6 mg/dL (ref 0.0–40.0)

## 2018-06-17 LAB — BASIC METABOLIC PANEL
BUN: 15 mg/dL (ref 6–23)
CALCIUM: 9.6 mg/dL (ref 8.4–10.5)
CO2: 33 mEq/L — ABNORMAL HIGH (ref 19–32)
CREATININE: 1.18 mg/dL (ref 0.40–1.50)
Chloride: 103 mEq/L (ref 96–112)
GFR: 59.52 mL/min — AB (ref >60.00)
GLUCOSE: 107 mg/dL — AB (ref 70–99)
Potassium: 4.6 mEq/L (ref 3.5–5.1)
Sodium: 142 mEq/L (ref 135–145)

## 2018-06-17 NOTE — Progress Notes (Signed)
Subjective:    Patient ID: Kyle Wise, male    DOB: 18-Mar-1941, 78 y.o.   MRN: [id number]  DOS:  06/17/2018 Type of visit - description: Routine visit, here with his wife Recently saw hematology, CBC was normal History of asthma: No recent problems High cholesterol: Due for labs. Med list reviewed: Good compliance.  Review of Systems   Past Medical History:  Diagnosis Date  . Allergy    seasonal  . Anemia   . Arthritis   . Asthma   . Emphysema of lung ([place])   . Frequent UTI   . History of colon polyps   . Hyperlipidemia   . Legal blindness   . 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 Osteoporosis     Past Surgical History:  Procedure Laterality Date  . APPENDECTOMY  1999  . BRAIN SURGERY  12/2008   meningioma   . COLON SURGERY  1999   colon resection  . EYE SURGERY     cataract  . KNEE ARTHROSCOPY Left 05/2009  . PROSTATE ABLATION  2009   "microwave procedure", Dr Hazle Nordmann  . TONSILLECTOMY AND ADENOIDECTOMY  1945    Social History   Socioeconomic History  . Marital status: Married    Spouse name: Not on file  . Number of children: 2  . Years of education: Not on file  . Highest education level: Not on file  Occupational History  . Occupation: retiredProgramme researcher, broadcasting/film/video for [employer]  Social Needs  . Financial resource strain: Not on file  . Food insecurity:    Worry: Not on file    Inability: Not on file  . Transportation needs:    Medical: Not on file    Non-medical: Not on file  Tobacco Use  . Smoking status: Never Smoker  . Smokeless tobacco: Never Used  Substance and Sexual Activity  . Alcohol use: Yes    Comment: socially  . Drug use: Never  . Sexual activity: Not on file  Lifestyle  . Physical activity:    Days per week: Not on file    Minutes per session: Not on file  . Stress: Not on file  Relationships  . Social connections:    Talks on phone: Not on file    Gets together: Not on file    Attends religious service: Not on file    Active member  of club or organization: Not on file    Attends meetings of clubs or organizations: Not on file    Relationship status: Not on file  . Intimate partner violence:    Fear of current or ex partner: Not on file    Emotionally abused: Not on file    Physically abused: Not on file    Forced sexual activity: Not on file  Other Topics Concern  . Not on file  Social History Narrative   Household: Pt and wife      Allergies as of 06/17/2018   No Known Allergies     Medication List       Accurate as of June 17, 2018 11:59 PM. Always use your most recent med list.        aspirin EC 81 MG tablet Take 81 mg by mouth daily.   liothyronine 5 MCG tablet Commonly known as:  CYTOMEL Take 5 mcg by mouth daily.   Magnesium 200 MG Tabs Take 200 mg by mouth 2 (two) times daily.   Melatonin 1 MG Caps Take 1 mg by mouth  daily.   multivitamin with minerals Tabs tablet Take 1 tablet by mouth daily.   NALTREXONE HCL PO Take 4.5 mg by mouth daily.   tamsulosin 0.4 MG Caps capsule Commonly known as:  FLOMAX Take 0.4 mg by mouth 2 (two) times daily.   thyroid 60 MG tablet Commonly known as:  ARMOUR Take 60 mg by mouth daily.           Objective:   Physical Exam BP 120/74 (BP Location: Left Arm, Patient Position: Sitting, Cuff Size: Small)   Pulse (!) 49   Temp 98 F (36.7 C) (Oral)   Resp 16   Ht 5\' 8"  (1.727 m)   Wt 162 lb 2 oz (73.5 kg)   SpO2 95%   BMI 24.65 kg/m  General:   Well developed, NAD, BMI noted. HEENT:  Normocephalic . Face symmetric, atraumatic Lungs:  CTA B Normal respiratory effort, no intercostal retractions, no accessory muscle use. Heart: RRR,  no murmur.  No pretibial edema bilaterally  Skin: Not pale. Not jaundice Neurologic:  alert & oriented X3.  Speech normal, gait appropriate for age and unassisted Psych--  Cognition and judgment appear intact.  Cooperative with normal attention span and concentration.  Behavior appropriate. No  anxious or depressed appearing.      Assessment    Assessment Hyperglycemia High cholesterol Thyroid disease: on citomel/armour rx by   Kirk Ruths NP Asthma DJD, history of knee arthroscopy 2011 Low-grade B-cell lymphoma, hematology [facility] GU: Elevated PSA, BPH, LUTS, ureteral stricture --TUMT September 2006 Dr. Burnell Blanks --Recurrent UTIs due to Citrobacter despite appropriate antibiotics. Not Rx for asx bacteriuria --Elevated PSA, range between 3 and 7, prostate biopsy 2005, + basal cell hyperplasia without malignancy -- Prostate MRI 08/2016 with no evidence of high-grade prostate cancer. Dr Alinda Money Legally blind due to meningioma surgery Colon polyp: s/p partial colectomy 1999, Dr Dorrene German Naltroxen: rx by Kirk Ruths NP [place] NP (holistic NP)  PLAN: High cholesterol: Diet controlled, check a FLP and BMP Thyroid disease: Follow-up elsewhere. Low-grade B-cell lymphoma.  Recently saw hematology, a CBC was done and apparently normal Preventive care: Sees GI every 3 years Immunizations reviewed, due for PNM 23 and a Td: Done today. RTC 6 to 8 months

## 2018-06-17 NOTE — Patient Instructions (Addendum)
Please schedule Medicare Wellness with Kyle Wise.   GO TO THE LAB : Get the blood work     GO TO THE FRONT DESK Schedule your next appointmentc

## 2018-06-17 NOTE — Progress Notes (Signed)
Pre visit review using our clinic review tool, if applicable. No additional management support is needed unless otherwise documented below in the visit note. 

## 2018-06-18 DIAGNOSIS — Z09 Encounter for follow-up examination after completed treatment for conditions other than malignant neoplasm: Secondary | ICD-10-CM | POA: Insufficient documentation

## 2018-06-18 NOTE — Assessment & Plan Note (Signed)
High cholesterol: Diet controlled, check a FLP and BMP Thyroid disease: Follow-up elsewhere. Low-grade B-cell lymphoma.  Recently saw hematology, a CBC was done and apparently normal Preventive care: Sees GI every 3 years Immunizations reviewed, due for PNM 23 and a Td: Done today. RTC 6 to 8 months

## 2018-07-05 DIAGNOSIS — E790 Hyperuricemia without signs of inflammatory arthritis and tophaceous disease: Secondary | ICD-10-CM | POA: Diagnosis not present

## 2018-07-05 DIAGNOSIS — E785 Hyperlipidemia, unspecified: Secondary | ICD-10-CM | POA: Diagnosis not present

## 2018-07-05 DIAGNOSIS — R5383 Other fatigue: Secondary | ICD-10-CM | POA: Diagnosis not present

## 2018-07-05 DIAGNOSIS — R7303 Prediabetes: Secondary | ICD-10-CM | POA: Diagnosis not present

## 2018-07-05 DIAGNOSIS — E568 Deficiency of other vitamins: Secondary | ICD-10-CM | POA: Diagnosis not present

## 2018-07-05 DIAGNOSIS — E039 Hypothyroidism, unspecified: Secondary | ICD-10-CM | POA: Diagnosis not present

## 2018-12-12 DIAGNOSIS — R5383 Other fatigue: Secondary | ICD-10-CM | POA: Diagnosis not present

## 2018-12-12 DIAGNOSIS — E039 Hypothyroidism, unspecified: Secondary | ICD-10-CM | POA: Diagnosis not present

## 2018-12-12 DIAGNOSIS — R799 Abnormal finding of blood chemistry, unspecified: Secondary | ICD-10-CM | POA: Diagnosis not present

## 2018-12-12 DIAGNOSIS — D518 Other vitamin B12 deficiency anemias: Secondary | ICD-10-CM | POA: Diagnosis not present

## 2018-12-12 DIAGNOSIS — E559 Vitamin D deficiency, unspecified: Secondary | ICD-10-CM | POA: Diagnosis not present

## 2018-12-12 DIAGNOSIS — E291 Testicular hypofunction: Secondary | ICD-10-CM | POA: Diagnosis not present

## 2018-12-12 LAB — HEPATIC FUNCTION PANEL
ALT: 19 (ref 10–40)
AST: 22 (ref 14–40)
Alkaline Phosphatase: 72 (ref 25–125)
Bilirubin, Total: 0.6

## 2018-12-12 LAB — LIPID PANEL
Cholesterol: 174 (ref 0–200)
HDL: 42 (ref 35–70)
LDL Cholesterol: 109
Triglycerides: 113 (ref 40–160)

## 2018-12-12 LAB — HEMOGLOBIN A1C: Hemoglobin A1C: 5.7

## 2018-12-12 LAB — CBC AND DIFFERENTIAL
HCT: 50 (ref 41–53)
Hemoglobin: 16.2 (ref 13.5–17.5)
Neutrophils Absolute: 3
Platelets: 164 (ref 150–399)
WBC: 5.3

## 2018-12-12 LAB — BASIC METABOLIC PANEL
BUN: 15 (ref 4–21)
Creatinine: 1.1 (ref 0.6–1.3)
Glucose: 105
Potassium: 4.2 (ref 3.4–5.3)
Sodium: 145 (ref 137–147)

## 2018-12-12 LAB — TSH: TSH: 0.01 — AB (ref 0.41–5.90)

## 2018-12-20 ENCOUNTER — Ambulatory Visit (INDEPENDENT_AMBULATORY_CARE_PROVIDER_SITE_OTHER): Payer: Medicare HMO | Admitting: Internal Medicine

## 2018-12-20 ENCOUNTER — Other Ambulatory Visit: Payer: Self-pay

## 2018-12-20 ENCOUNTER — Encounter: Payer: Self-pay | Admitting: Internal Medicine

## 2018-12-20 VITALS — BP 135/71 | HR 74 | Temp 97.6°F | Resp 16 | Ht 68.0 in | Wt 152.0 lb

## 2018-12-20 DIAGNOSIS — Z0000 Encounter for general adult medical examination without abnormal findings: Secondary | ICD-10-CM | POA: Diagnosis not present

## 2018-12-20 DIAGNOSIS — E7800 Pure hypercholesterolemia, unspecified: Secondary | ICD-10-CM | POA: Diagnosis not present

## 2018-12-20 NOTE — Patient Instructions (Addendum)
   GO TO THE FRONT DESK Schedule your next appointment   For a follow up in 8 months  Consider [facility]  Get a flu shot as soon as available   Get me a copy of your recently done labs

## 2018-12-20 NOTE — Progress Notes (Signed)
Subjective:    Patient ID: Kyle Wise, male    DOB: 11-09-1940, 78 y.o.   MRN: [id number]  DOS:  12/20/2018 Type of visit - description: CPX His main concern is ongoing pain on the left knee, otherwise feels well, following all regular precautions for COVID-19.  Review of Systems  Other than above, a 14 point review of systems is negative     Past Medical History:  Diagnosis Date  . Allergy    seasonal  . Anemia   . Arthritis   . Asthma   . Emphysema of lung ([place])   . Frequent UTI   . History of colon polyps   . Hyperlipidemia   . Legal blindness   . Low grade B-cell lymphoma ([place])   . Osteoporosis     Past Surgical History:  Procedure Laterality Date  . APPENDECTOMY  1999  . BRAIN SURGERY  12/2008   meningioma   . COLON SURGERY  1999   colon resection  . EYE SURGERY     cataract  . KNEE ARTHROSCOPY Left 05/2009  . PROSTATE ABLATION  2009   "microwave procedure", Dr Hazle Nordmann  . TONSILLECTOMY AND ADENOIDECTOMY  1945    Social History   Socioeconomic History  . Marital status: Married    Spouse name: Not on file  . Number of children: 2  . Years of education: Not on file  . Highest education level: Not on file  Occupational History  . Occupation: retiredProgramme researcher, broadcasting/film/video for [employer]  Social Needs  . Financial resource strain: Not on file  . Food insecurity    Worry: Not on file    Inability: Not on file  . Transportation needs    Medical: Not on file    Non-medical: Not on file  Tobacco Use  . Smoking status: Never Smoker  . Smokeless tobacco: Never Used  Substance and Sexual Activity  . Alcohol use: Yes    Comment: socially  . Drug use: Never  . Sexual activity: Not on file  Lifestyle  . Physical activity    Days per week: Not on file    Minutes per session: Not on file  . Stress: Not on file  Relationships  . Social Herbalist on phone: Not on file    Gets together: Not on file    Attends religious service: Not on file    Active  member of club or organization: Not on file    Attends meetings of clubs or organizations: Not on file    Relationship status: Not on file  . Intimate partner violence    Fear of current or ex partner: Not on file    Emotionally abused: Not on file    Physically abused: Not on file    Forced sexual activity: Not on file  Other Topics Concern  . Not on file  Social History Narrative   Household: Pt and wife     Family History  Problem Relation Age of Onset  . CAD Mother        MI age 37  . Arthritis Mother   . Heart attack Mother   . Cancer Mother   . Colon cancer Father   . Arthritis Father   . Diabetes Maternal Grandmother   . CAD Maternal Grandfather   . Heart attack Maternal Grandfather   . Asthma Brother   . Arthritis Brother   . Cancer Paternal Grandmother   . Prostate cancer Neg Hx  Allergies as of 12/20/2018   No Known Allergies     Medication List       Accurate as of December 20, 2018 11:59 PM. If you have any questions, ask your nurse or doctor.        aspirin EC 81 MG tablet Take 81 mg by mouth daily.   liothyronine 5 MCG tablet Commonly known as: CYTOMEL Take 5 mcg by mouth daily.   Magnesium 200 MG Tabs Take 200 mg by mouth 2 (two) times daily.   Melatonin 1 MG Caps Take 1 mg by mouth daily.   multivitamin with minerals Tabs tablet Take 1 tablet by mouth daily.   NALTREXONE HCL PO Take 4.5 mg by mouth daily.   tamsulosin 0.4 MG Caps capsule Commonly known as: FLOMAX Take 0.4 mg by mouth 2 (two) times daily.   thyroid 60 MG tablet Commonly known as: ARMOUR Take 60 mg by mouth daily.           Objective:   Physical Exam BP 135/71 (BP Location: Left Arm, Patient Position: Sitting, Cuff Size: Normal)   Pulse 74   Temp 97.6 F (36.4 C) (Oral)   Resp 16   Ht 5\' 8"  (1.727 m)   Wt 152 lb (68.9 kg)   SpO2 99%   BMI 23.11 kg/m  General: Well developed, NAD, BMI noted Neck: No  thyromegaly  HEENT:  Normocephalic . Face  symmetric, atraumatic Lungs:  CTA B Normal respiratory effort, no intercostal retractions, no accessory muscle use. Heart: RRR,  no murmur.  No pretibial edema bilaterally  Abdomen:  Not distended, soft, non-tender. No rebound or rigidity.   Skin: Exposed areas without rash. Not pale. Not jaundice MSK: Both knees with significant bony changes consistent with DJD, no effusion or redness.  More changes noted on the left Neurologic:  alert & oriented X3.  Speech normal, gait quite limited by DJD Strength symmetric and appropriate for age.  Psych: Cognition and judgment appear intact.  Cooperative with normal attention span and concentration.  Behavior appropriate. No anxious or depressed appearing.     Assessment    ASSESSMENT  Hyperglycemia High cholesterol Thyroid disease: on citomel/armour rx by   Kirk Ruths NP Asthma DJD, history of knee arthroscopy 2011 Low-grade B-cell lymphoma, hematology [facility] GU: Elevated PSA, BPH, LUTS, ureteral stricture --TUMT September 2006 Dr. Burnell Blanks --Recurrent UTIs due to Citrobacter despite appropriate antibiotics. Not Rx for asx bacteriuria --Elevated PSA, range between 3 and 7, prostate biopsy 2005, + basal cell hyperplasia without malignancy -- Prostate MRI 08/2016 with no evidence of high-grade prostate cancer. Dr Alinda Money Legally blind due to meningioma surgery Colon polyp: s/p partial colectomy 1999, Dr Dorrene German Naltroxen: rx by Kirk Ruths NP [place] NP (holistic NP)  PLAN: Here for CPX High cholesterol: Diet controlled, last LDL 111, had labs last week, will try to get them.   EKG: sinus brady, no brady sxs. Thyroid disease: Follow-up Ross Marcus NP, request a referral.  Will do. DJD: Very limited by left knee DJD, reports he will contact [place] office when ready to do something.  Until then a brace, Tylenol and Ice RTC 8 months

## 2018-12-20 NOTE — Assessment & Plan Note (Signed)
td 2020 PNM 23: 2020 PNM 13: 2018 shingrix discussed, reluctant to proceed  Flu shot strongly recommended CCS: last Cscope 09/01/2018 , + polyps , Dr Sheldon Silvan, pt reports is due, declined referral , he will wait for GI call Prostate Ca screening : sees urology Labs: Had recent labs done elsewhere, will get records

## 2018-12-22 NOTE — Assessment & Plan Note (Signed)
Here for CPX High cholesterol: Diet controlled, last LDL 111, had labs last week, will try to get them.   EKG: sinus brady, no brady sxs. Thyroid disease: Follow-up Ross Marcus NP, request a referral.  Will do. DJD: Very limited by left knee DJD, reports he will contact [facility] office when ready to do something.  Until then a brace, Tylenol and Ice RTC 8 months

## 2018-12-27 DIAGNOSIS — R1900 Intra-abdominal and pelvic swelling, mass and lump, unspecified site: Secondary | ICD-10-CM | POA: Diagnosis not present

## 2018-12-27 DIAGNOSIS — D696 Thrombocytopenia, unspecified: Secondary | ICD-10-CM | POA: Diagnosis not present

## 2018-12-27 DIAGNOSIS — Z8572 Personal history of non-Hodgkin lymphomas: Secondary | ICD-10-CM | POA: Diagnosis not present

## 2018-12-27 DIAGNOSIS — C8510 Unspecified B-cell lymphoma, unspecified site: Secondary | ICD-10-CM | POA: Diagnosis not present

## 2018-12-27 LAB — BASIC METABOLIC PANEL
BUN: 14 (ref 4–21)
Creatinine: 0.9 (ref 0.6–1.3)
Glucose: 89
Potassium: 4 (ref 3.4–5.3)
Sodium: 139 (ref 137–147)

## 2018-12-27 LAB — HEPATIC FUNCTION PANEL
ALT: 22 (ref 10–40)
AST: 21 (ref 14–40)
Alkaline Phosphatase: 67 (ref 25–125)
Bilirubin, Total: 0.7

## 2018-12-27 LAB — CBC AND DIFFERENTIAL
HCT: 50 (ref 41–53)
Hemoglobin: 16.6 (ref 13.5–17.5)
Neutrophils Absolute: 5
Platelets: 152 (ref 150–399)
WBC: 6.5

## 2019-01-01 DIAGNOSIS — E790 Hyperuricemia without signs of inflammatory arthritis and tophaceous disease: Secondary | ICD-10-CM | POA: Diagnosis not present

## 2019-01-01 DIAGNOSIS — E611 Iron deficiency: Secondary | ICD-10-CM | POA: Diagnosis not present

## 2019-01-01 DIAGNOSIS — E039 Hypothyroidism, unspecified: Secondary | ICD-10-CM | POA: Diagnosis not present

## 2019-01-01 DIAGNOSIS — R197 Diarrhea, unspecified: Secondary | ICD-10-CM | POA: Diagnosis not present

## 2019-01-01 DIAGNOSIS — R7303 Prediabetes: Secondary | ICD-10-CM | POA: Diagnosis not present

## 2019-01-07 ENCOUNTER — Encounter: Payer: Self-pay | Admitting: Internal Medicine

## 2019-01-07 ENCOUNTER — Telehealth: Payer: Self-pay | Admitting: Internal Medicine

## 2019-01-07 NOTE — Telephone Encounter (Signed)
Labs from 12/12/2018: CBC normal. Creatinine 1.0. LFTs normal. Total cholesterol 174, LDL 109 (stable compared to 06/17/2018. TSH 0.0 13, low, thyroid dz managed by another practitioner.

## 2019-01-29 DIAGNOSIS — H53001 Unspecified amblyopia, right eye: Secondary | ICD-10-CM | POA: Diagnosis not present

## 2019-01-29 DIAGNOSIS — H4720 Unspecified optic atrophy: Secondary | ICD-10-CM | POA: Diagnosis not present

## 2019-01-29 DIAGNOSIS — Z961 Presence of intraocular lens: Secondary | ICD-10-CM | POA: Diagnosis not present

## 2019-04-11 DIAGNOSIS — R972 Elevated prostate specific antigen [PSA]: Secondary | ICD-10-CM | POA: Diagnosis not present

## 2019-04-18 DIAGNOSIS — R8271 Bacteriuria: Secondary | ICD-10-CM | POA: Diagnosis not present

## 2019-04-18 DIAGNOSIS — R972 Elevated prostate specific antigen [PSA]: Secondary | ICD-10-CM | POA: Diagnosis not present

## 2019-04-18 DIAGNOSIS — N401 Enlarged prostate with lower urinary tract symptoms: Secondary | ICD-10-CM | POA: Diagnosis not present

## 2019-04-18 DIAGNOSIS — R3912 Poor urinary stream: Secondary | ICD-10-CM | POA: Diagnosis not present

## 2019-06-27 ENCOUNTER — Ambulatory Visit: Payer: Medicare HMO

## 2019-07-02 DIAGNOSIS — G620 Drug-induced polyneuropathy: Secondary | ICD-10-CM | POA: Diagnosis not present

## 2019-07-02 DIAGNOSIS — C8510 Unspecified B-cell lymphoma, unspecified site: Secondary | ICD-10-CM | POA: Diagnosis not present

## 2019-07-02 DIAGNOSIS — T451X5A Adverse effect of antineoplastic and immunosuppressive drugs, initial encounter: Secondary | ICD-10-CM | POA: Diagnosis not present

## 2019-07-03 ENCOUNTER — Ambulatory Visit: Payer: Medicare HMO | Attending: Internal Medicine

## 2019-07-03 DIAGNOSIS — Z23 Encounter for immunization: Secondary | ICD-10-CM | POA: Insufficient documentation

## 2019-07-03 NOTE — Progress Notes (Signed)
   Covid-19 Vaccination Clinic  Name:  Kyle Wise    MRN: [id number] DOB: [birth year]  07/03/2019  [name] was observed post Covid-19 immunization for 15 minutes without incidence. He was provided with Vaccine Information Sheet and instruction to access the V-Safe system.   [name] was instructed to call 911 with any severe reactions post vaccine: Marland Kitchen Difficulty breathing  . Swelling of your face and throat  . A fast heartbeat  . A bad rash all over your body  . Dizziness and weakness    Immunizations Administered    Name Date Dose VIS Date Route   Pfizer COVID-19 Vaccine 07/03/2019  5:43 PM 0.3 mL 05/09/2019 Intramuscular   Manufacturer: [place]   Lot: [id number]   [place]: [id number]

## 2019-07-15 DIAGNOSIS — E039 Hypothyroidism, unspecified: Secondary | ICD-10-CM | POA: Diagnosis not present

## 2019-07-15 DIAGNOSIS — E785 Hyperlipidemia, unspecified: Secondary | ICD-10-CM | POA: Diagnosis not present

## 2019-07-15 DIAGNOSIS — E611 Iron deficiency: Secondary | ICD-10-CM | POA: Diagnosis not present

## 2019-07-15 DIAGNOSIS — R7303 Prediabetes: Secondary | ICD-10-CM | POA: Diagnosis not present

## 2019-07-15 DIAGNOSIS — E790 Hyperuricemia without signs of inflammatory arthritis and tophaceous disease: Secondary | ICD-10-CM | POA: Diagnosis not present

## 2019-07-15 DIAGNOSIS — R5383 Other fatigue: Secondary | ICD-10-CM | POA: Diagnosis not present

## 2019-07-15 DIAGNOSIS — R197 Diarrhea, unspecified: Secondary | ICD-10-CM | POA: Diagnosis not present

## 2019-07-15 LAB — BASIC METABOLIC PANEL
BUN: 18 (ref 4–21)
CO2: 25 — AB (ref 13–22)
Chloride: 104 (ref 99–108)
Creatinine: 1.1 (ref 0.6–1.3)
Glucose: 103
Potassium: 4.4 mEq/L (ref 3.5–5.1)
Sodium: 144 (ref 137–147)

## 2019-07-15 LAB — HEPATIC FUNCTION PANEL
ALT: 17 U/L (ref 10–40)
AST: 21 (ref 14–40)
Alkaline Phosphatase: 74 (ref 25–125)
Bilirubin, Total: 0.5

## 2019-07-15 LAB — LIPID PANEL
Cholesterol: 170 (ref 0–200)
HDL: 43 (ref 35–70)
LDL Cholesterol: 112
Triglycerides: 79 (ref 40–160)

## 2019-07-15 LAB — CBC AND DIFFERENTIAL
HCT: 48 (ref 41–53)
Hemoglobin: 15.8 (ref 13.5–17.5)
Neutrophils Absolute: 4.6
Platelets: 169 10*3/uL (ref 150–400)
WBC: 6.6

## 2019-07-15 LAB — TSH: TSH: 0.8 (ref 0.41–5.90)

## 2019-07-15 LAB — COMPREHENSIVE METABOLIC PANEL
Albumin: 4.3 (ref 3.5–5.0)
Calcium: 9.2 (ref 8.7–10.7)
Globulin: 1.9
eGFR: 60

## 2019-07-15 LAB — TESTOSTERONE: Testosterone: 789

## 2019-07-15 LAB — CBC: RBC: 5.86 — AB (ref 3.87–5.11)

## 2019-07-28 DIAGNOSIS — E039 Hypothyroidism, unspecified: Secondary | ICD-10-CM | POA: Diagnosis not present

## 2019-07-28 DIAGNOSIS — R197 Diarrhea, unspecified: Secondary | ICD-10-CM | POA: Diagnosis not present

## 2019-07-28 DIAGNOSIS — E785 Hyperlipidemia, unspecified: Secondary | ICD-10-CM | POA: Diagnosis not present

## 2019-07-28 DIAGNOSIS — R7303 Prediabetes: Secondary | ICD-10-CM | POA: Diagnosis not present

## 2019-07-28 DIAGNOSIS — E790 Hyperuricemia without signs of inflammatory arthritis and tophaceous disease: Secondary | ICD-10-CM | POA: Diagnosis not present

## 2019-07-29 ENCOUNTER — Ambulatory Visit: Payer: Medicare HMO | Attending: Internal Medicine

## 2019-07-29 DIAGNOSIS — Z23 Encounter for immunization: Secondary | ICD-10-CM | POA: Insufficient documentation

## 2019-07-29 NOTE — Progress Notes (Signed)
   Covid-19 Vaccination Clinic  Name:  Kyle Wise    MRN: [id number] DOB: May 01, 1941  07/29/2019  [name] was observed post Covid-19 immunization for 15 minutes without incident. He was provided with Vaccine Information Sheet and instruction to access the V-Safe system.   [name] was instructed to call 911 with any severe reactions post vaccine: Marland Kitchen Difficulty breathing  . Swelling of face and throat  . A fast heartbeat  . A bad rash all over body  . Dizziness and weakness   Immunizations Administered    Name Date Dose VIS Date Route   Pfizer COVID-19 Vaccine 07/29/2019  9:02 AM 0.3 mL 05/09/2019 Intramuscular   Manufacturer: [place]   Lot: [id number]   [place]: [id number]

## 2019-08-19 ENCOUNTER — Other Ambulatory Visit: Payer: Self-pay

## 2019-08-19 ENCOUNTER — Encounter: Payer: Self-pay | Admitting: Internal Medicine

## 2019-08-19 ENCOUNTER — Ambulatory Visit (INDEPENDENT_AMBULATORY_CARE_PROVIDER_SITE_OTHER): Payer: Medicare HMO | Admitting: Internal Medicine

## 2019-08-19 VITALS — BP 120/64 | HR 61 | Temp 96.1°F | Resp 16 | Ht 68.0 in | Wt 156.5 lb

## 2019-08-19 DIAGNOSIS — E7800 Pure hypercholesterolemia, unspecified: Secondary | ICD-10-CM

## 2019-08-19 DIAGNOSIS — E079 Disorder of thyroid, unspecified: Secondary | ICD-10-CM

## 2019-08-19 DIAGNOSIS — R739 Hyperglycemia, unspecified: Secondary | ICD-10-CM | POA: Insufficient documentation

## 2019-08-19 NOTE — Progress Notes (Signed)
Pre visit review using our clinic review tool, if applicable. No additional management support is needed unless otherwise documented below in the visit note. 

## 2019-08-19 NOTE — Assessment & Plan Note (Signed)
Labs from 07/02/2019 reviewed: Potassium 4.2, creatinine 1.1, LFTs normal, CBC showed a hemoglobin of 16. Hyperglycemia: Last A1c very good, has a healthy lifestyle. High cholesterol: Well-controlled per last FLP Thyroid disease: Managed elsewhere Low-grade B-cell lymphoma: Saw oncology recently, was told stable. Saw GI, to have a colonoscopy this week, Dr Sheldon Silvan Aspirin: Okay to stop aspirin for primary prevention due to age RTC 4 months CPX

## 2019-08-19 NOTE — Patient Instructions (Addendum)
Please schedule Medicare Wellness with Glenard Haring.      GO TO THE FRONT DESK, please reschedule your appointments Come back for   A physical exam in 4  months, fasting

## 2019-08-19 NOTE — Progress Notes (Signed)
Subjective:    Patient ID: Kyle Wise, male    DOB: July 09, 1940, 79 y.o.   MRN: [id number]  DOS:  08/19/2019 Type of visit - description: f/u  In general feeling well. Notes from GI and oncology reviewed.   Review of Systems Denies fever chills He has occasional diarrhea but no nausea or vomiting. Chronic LUTS, at baseline.  Past Medical History:  Diagnosis Date  . Allergy    seasonal  . Anemia   . Arthritis   . Asthma   . Emphysema of lung ([place])   . Frequent UTI   . History of colon polyps   . Hyperlipidemia   . Legal blindness   . Low grade B-cell lymphoma ([place])   . Osteoporosis     Past Surgical History:  Procedure Laterality Date  . APPENDECTOMY  1999  . BRAIN SURGERY  12/2008   meningioma   . COLON SURGERY  1999   colon resection  . EYE SURGERY     cataract  . KNEE ARTHROSCOPY Left 05/2009  . PROSTATE ABLATION  2009   "microwave procedure", Dr Hazle Nordmann  . TONSILLECTOMY AND ADENOIDECTOMY  1945    Allergies as of 08/19/2019   No Known Allergies     Medication List       Accurate as of August 19, 2019  6:43 PM. If you have any questions, ask your nurse or doctor.        STOP taking these medications   aspirin EC 81 MG tablet Stopped by: Kathlene November, MD     TAKE these medications   liothyronine 5 MCG tablet Commonly known as: CYTOMEL Take 10 mcg by mouth daily.   Magnesium 200 MG Tabs Take 200 mg by mouth 2 (two) times daily.   Melatonin 1 MG Caps Take 1 mg by mouth daily.   multivitamin with minerals Tabs tablet Take 1 tablet by mouth daily.   NALTREXONE HCL PO Take 4.5 mg by mouth daily.   tamsulosin 0.4 MG Caps capsule Commonly known as: FLOMAX Take 0.4 mg by mouth 2 (two) times daily.   thyroid 90 MG tablet Commonly known as: ARMOUR Take 90 mg by mouth daily. What changed: Another medication with the same name was removed. Continue taking this medication, and follow the directions you see here. Changed by: Kathlene November, MD           Objective:   Physical Exam BP 120/64 (BP Location: Left Arm, Patient Position: Sitting, Cuff Size: Normal)   Pulse 61   Temp (!) 96.1 F (35.6 C) (Temporal)   Resp 16   Ht 5\' 8"  (1.727 m)   Wt 156 lb 8 oz (71 kg)   SpO2 95%   BMI 23.80 kg/m  General:   Well developed, NAD, BMI noted. HEENT:  Normocephalic . Face symmetric, atraumatic Lungs:  CTA B Normal respiratory effort, no intercostal retractions, no accessory muscle use. Heart: RRR,  no murmur.  Lower extremities: no pretibial edema bilaterally  Skin: Not pale. Not jaundice Neurologic:  alert & oriented X3.  Speech normal, gait appropriate for age and unassisted Psych--  Cognition and judgment appear intact.  Cooperative with normal attention span and concentration.  Behavior appropriate. No anxious or depressed appearing.      Assessment     ASSESSMENT  Hyperglycemia High cholesterol Thyroid disease: on citomel/armour rx by   Kirk Ruths NP Asthma DJD, history of knee arthroscopy 2011 Low-grade B-cell lymphoma, hematology [facility] GU: Elevated PSA, BPH, LUTS, ureteral stricture --  TUMT September 2006 Dr. Burnell Blanks --Recurrent UTIs due to Citrobacter despite appropriate antibiotics. Not Rx for asx bacteriuria --Elevated PSA, range between 3 and 7, prostate biopsy 2005, + basal cell hyperplasia without malignancy -- Prostate MRI 08/2016 with no evidence of high-grade prostate cancer. Dr Alinda Money Legally blind due to meningioma surgery Colon polyp: s/p partial colectomy 1999, Dr Dorrene German Naltroxen: rx by Kirk Ruths NP [place] NP (holistic NP)  PLAN: Labs from 07/02/2019 reviewed: Potassium 4.2, creatinine 1.1, LFTs normal, CBC showed a hemoglobin of 16. Hyperglycemia: Last A1c very good, has a healthy lifestyle. High cholesterol: Well-controlled per last FLP Thyroid disease: Managed elsewhere Low-grade B-cell lymphoma: Saw oncology recently, was told stable. Saw GI, to have a colonoscopy this week, Dr Sheldon Silvan  Aspirin: Okay to stop aspirin for primary prevention due to age RTC 4 months CPX   This visit occurred during the SARS-CoV-2 public health emergency.  Safety protocols were in place, including screening questions prior to the visit, additional usage of staff PPE, and extensive cleaning of exam room while observing appropriate contact time as indicated for disinfecting solutions.

## 2019-08-22 DIAGNOSIS — Z800 Family history of malignant neoplasm of digestive organs: Secondary | ICD-10-CM | POA: Diagnosis not present

## 2019-08-22 DIAGNOSIS — Z86010 Personal history of colonic polyps: Secondary | ICD-10-CM | POA: Diagnosis not present

## 2019-08-22 DIAGNOSIS — K648 Other hemorrhoids: Secondary | ICD-10-CM | POA: Diagnosis not present

## 2019-08-22 DIAGNOSIS — Z1211 Encounter for screening for malignant neoplasm of colon: Secondary | ICD-10-CM | POA: Diagnosis not present

## 2019-08-22 DIAGNOSIS — K649 Unspecified hemorrhoids: Secondary | ICD-10-CM | POA: Diagnosis not present

## 2019-08-22 LAB — HM COLONOSCOPY

## 2019-10-03 DIAGNOSIS — H4720 Unspecified optic atrophy: Secondary | ICD-10-CM | POA: Diagnosis not present

## 2019-10-03 DIAGNOSIS — H53021 Refractive amblyopia, right eye: Secondary | ICD-10-CM | POA: Diagnosis not present

## 2019-10-03 DIAGNOSIS — H31092 Other chorioretinal scars, left eye: Secondary | ICD-10-CM | POA: Diagnosis not present

## 2019-10-03 DIAGNOSIS — Z961 Presence of intraocular lens: Secondary | ICD-10-CM | POA: Diagnosis not present

## 2019-12-24 ENCOUNTER — Other Ambulatory Visit: Payer: Self-pay

## 2019-12-24 ENCOUNTER — Encounter: Payer: Self-pay | Admitting: Internal Medicine

## 2019-12-24 ENCOUNTER — Ambulatory Visit (INDEPENDENT_AMBULATORY_CARE_PROVIDER_SITE_OTHER): Payer: Medicare HMO | Admitting: Internal Medicine

## 2019-12-24 VITALS — BP 126/67 | HR 47 | Temp 97.9°F | Resp 16 | Ht 68.0 in | Wt 146.0 lb

## 2019-12-24 DIAGNOSIS — M25562 Pain in left knee: Secondary | ICD-10-CM

## 2019-12-24 DIAGNOSIS — R634 Abnormal weight loss: Secondary | ICD-10-CM

## 2019-12-24 DIAGNOSIS — E0590 Thyrotoxicosis, unspecified without thyrotoxic crisis or storm: Secondary | ICD-10-CM | POA: Diagnosis not present

## 2019-12-24 DIAGNOSIS — E079 Disorder of thyroid, unspecified: Secondary | ICD-10-CM

## 2019-12-24 DIAGNOSIS — Z0001 Encounter for general adult medical examination with abnormal findings: Secondary | ICD-10-CM

## 2019-12-24 DIAGNOSIS — R739 Hyperglycemia, unspecified: Secondary | ICD-10-CM | POA: Diagnosis not present

## 2019-12-24 DIAGNOSIS — R197 Diarrhea, unspecified: Secondary | ICD-10-CM | POA: Diagnosis not present

## 2019-12-24 DIAGNOSIS — Z0000 Encounter for general adult medical examination without abnormal findings: Secondary | ICD-10-CM

## 2019-12-24 DIAGNOSIS — E7800 Pure hypercholesterolemia, unspecified: Secondary | ICD-10-CM | POA: Diagnosis not present

## 2019-12-24 LAB — COMPREHENSIVE METABOLIC PANEL
ALT: 21 U/L (ref 0–53)
AST: 20 U/L (ref 0–37)
Albumin: 4.2 g/dL (ref 3.5–5.2)
Alkaline Phosphatase: 60 U/L (ref 39–117)
BUN: 15 mg/dL (ref 6–23)
CO2: 31 mEq/L (ref 19–32)
Calcium: 9.2 mg/dL (ref 8.4–10.5)
Chloride: 106 mEq/L (ref 96–112)
Creatinine, Ser: 0.95 mg/dL (ref 0.40–1.50)
GFR: 76.14 mL/min (ref >60.00)
Glucose, Bld: 104 mg/dL — ABNORMAL HIGH (ref 70–99)
Potassium: 4 mEq/L (ref 3.5–5.1)
Sodium: 142 mEq/L (ref 135–145)
Total Bilirubin: 0.6 mg/dL (ref 0.2–1.2)
Total Protein: 6.4 g/dL (ref 6.0–8.3)

## 2019-12-24 LAB — CBC WITH DIFFERENTIAL/PLATELET
Basophils Absolute: 0 10*3/uL (ref 0.0–0.1)
Basophils Relative: 0.5 % (ref 0.0–3.0)
Eosinophils Absolute: 0.1 10*3/uL (ref 0.0–0.7)
Eosinophils Relative: 1.5 % (ref 0.0–5.0)
HCT: 47.2 % (ref 39.0–52.0)
Hemoglobin: 15.7 g/dL (ref 13.0–17.0)
Lymphocytes Relative: 16.5 % (ref 12.0–46.0)
Lymphs Abs: 0.8 10*3/uL (ref 0.7–4.0)
MCHC: 33.3 g/dL (ref 30.0–36.0)
MCV: 81.6 fl (ref 78.0–100.0)
Monocytes Absolute: 0.5 10*3/uL (ref 0.1–1.0)
Monocytes Relative: 9.6 % (ref 3.0–12.0)
Neutro Abs: 3.6 10*3/uL (ref 1.4–7.7)
Neutrophils Relative %: 71.9 % (ref 43.0–77.0)
Platelets: 144 10*3/uL — ABNORMAL LOW (ref 150.0–400.0)
RBC: 5.78 Mil/uL (ref 4.22–5.81)
RDW: 14 % (ref 11.5–15.5)
WBC: 5.1 10*3/uL (ref 4.0–10.5)

## 2019-12-24 LAB — HEMOGLOBIN A1C: Hgb A1c MFr Bld: 5.6 % (ref 4.6–6.5)

## 2019-12-24 LAB — IBC + FERRITIN
Ferritin: 19.4 ng/mL — ABNORMAL LOW (ref 22.0–322.0)
Iron: 123 ug/dL (ref 42–165)
Saturation Ratios: 29.8 % (ref 20.0–50.0)
Transferrin: 295 mg/dL (ref 212.0–360.0)

## 2019-12-24 LAB — LIPID PANEL
Cholesterol: 161 mg/dL (ref 0–200)
HDL: 40.1 mg/dL (ref >39.00)
LDL Cholesterol: 103 mg/dL — ABNORMAL HIGH (ref 0–99)
NonHDL: 120.52
Total CHOL/HDL Ratio: 4
Triglycerides: 90 mg/dL (ref 0.0–149.0)
VLDL: 18 mg/dL (ref 0.0–40.0)

## 2019-12-24 LAB — T4, FREE: Free T4: 0.94 ng/dL (ref 0.60–1.60)

## 2019-12-24 LAB — TSH: TSH: <0.01 u[IU]/mL — ABNORMAL LOW (ref 0.35–4.50)

## 2019-12-24 LAB — T3, FREE: T3, Free: 4.8 pg/mL — ABNORMAL HIGH (ref 2.3–4.2)

## 2019-12-24 NOTE — Assessment & Plan Note (Signed)
Here for CPX Hyperglycemia: Check A1c High cholesterol: Diet controlled, check labs Thyroid disease: Follow-up elsewhere, checking labs due to weight loss and diarrhea Low-grade B-cell lymphoma: Saw hematology 06-2019, visits every 6 months, plan is to continue surveillance.  On today's exam, no lymphadenopathies. Diarrhea, weight loss: Normal colonoscopy 08/22/2019. Unclear if he discussed chronic diarrhea with GI.    Checking labs, checking stool for C. difficile and WBCs. Further advised with results. Left knee pain: Request a referral to Dr. Mardelle Matte, will arrange RTC 3 months

## 2019-12-24 NOTE — Progress Notes (Signed)
Subjective:    Patient ID: Kyle Wise, male    DOB: 1940/11/10, 79 y.o.   MRN: [id number]  DOS:  12/24/2019 Type of visit - description: CPX Today he reports chronic diarrhea, going on for months?  Years?  The patient is not completely clear. He did say that for the last few months he has 3 or 4 watery stools, no associated nausea, vomiting, blood in the stools. States that he really does not feel ill or bad from it. I noted weight loss. Denies any lumps at the neck or under her arms, no night sweats. Emotionally doing okay. He had a colonoscopy recently.   Wt Readings from Last 3 Encounters:  12/24/19 146 lb (66.2 kg)  08/19/19 156 lb 8 oz (71 kg)  12/20/18 152 lb (68.9 kg)     Review of Systems  Other than above, a 14 point review of systems is negative    Past Medical History:  Diagnosis Date  . Allergy    seasonal  . Anemia   . Arthritis   . Asthma   . Frequent UTI   . History of colon polyps   . Hyperlipidemia   . Legal blindness   . Low grade B-cell lymphoma ([place])   . Osteoporosis     Past Surgical History:  Procedure Laterality Date  . APPENDECTOMY  1999  . BRAIN SURGERY  12/2008   meningioma   . COLON SURGERY  1999   colon resection  . EYE SURGERY     cataract  . KNEE ARTHROSCOPY Left 05/2009  . PROSTATE ABLATION  2009   "microwave procedure", Dr Hazle Nordmann  . TONSILLECTOMY AND ADENOIDECTOMY  1945    Allergies as of 12/24/2019   No Known Allergies     Medication List       Accurate as of December 24, 2019  9:14 PM. If you have any questions, ask your nurse or doctor.        liothyronine 5 MCG tablet Commonly known as: CYTOMEL Take 10 mcg by mouth daily.   Magnesium 200 MG Tabs Take 200 mg by mouth 2 (two) times daily.   Melatonin 1 MG Caps Take 1 mg by mouth daily.   multivitamin with minerals Tabs tablet Take 1 tablet by mouth daily.   NALTREXONE HCL PO Take 4.5 mg by mouth daily.   tamsulosin 0.4 MG Caps capsule Commonly known  as: FLOMAX Take 0.4 mg by mouth 2 (two) times daily.   thyroid 90 MG tablet Commonly known as: ARMOUR Take 90 mg by mouth daily.         Objective:   Physical Exam BP 126/67 (BP Location: Left Arm, Patient Position: Sitting, Cuff Size: Small)   Pulse 47   Temp 97.9 F (36.6 C) (Oral)   Resp 16   Ht 5\' 8"  (1.727 m)   Wt 146 lb (66.2 kg)   SpO2 95%   BMI 22.20 kg/m  General: Well developed, NAD, BMI noted, he seems a slightly underweight appearing and slightly more frail than during previous visits Neck: No  thyromegaly Lymphatic system: No lymphadenopathies at the neck, supraclavicular area, axillary area or groins. HEENT:  Normocephalic . Face symmetric, atraumatic Lungs:  CTA B Normal respiratory effort, no intercostal retractions, no accessory muscle use. Heart: RRR,  no murmur.  Abdomen:  Not distended, soft, non-tender. No rebound or rigidity.  No organomegaly Lower extremities: no pretibial edema bilaterally  Skin: Exposed areas without rash. Not pale. Not jaundice Neurologic:  alert & oriented X3.  Speech normal, gait appropriate for age and unassisted Strength symmetric and appropriate for age.  Psych: Cognition and judgment appear intact.  Cooperative with normal attention span and concentration.  Behavior appropriate. No anxious or depressed appearing.     Assessment    ASSESSMENT  Hyperglycemia High cholesterol Thyroid disease: on citomel/armour rx by   Kirk Ruths NP Asthma DJD, history of knee arthroscopy 2011 Low-grade B-cell lymphoma, hematology [facility] GU: Elevated PSA, BPH, LUTS, ureteral stricture --TUMT September 2006 Dr. Burnell Blanks --Recurrent UTIs due to Citrobacter despite appropriate antibiotics. Not Rx for asx bacteriuria --Elevated PSA, range between 3 and 7, prostate biopsy 2005, + basal cell hyperplasia without malignancy -- Prostate MRI 08/2016 with no evidence of high-grade prostate cancer. Dr Alinda Money Legally blind due to meningioma  surgery Colon polyp: s/p partial colectomy 1999, Dr Dorrene German Naltroxen: rx by Kirk Ruths NP [place] NP (holistic NP)  PLAN: Here for CPX Hyperglycemia: Check A1c High cholesterol: Diet controlled, check labs Thyroid disease: Follow-up elsewhere, checking labs due to weight loss and diarrhea Low-grade B-cell lymphoma: Saw hematology 06-2019, visits every 6 months, plan is to continue surveillance.  On today's exam, no lymphadenopathies. Diarrhea, weight loss: Normal colonoscopy 08/22/2019. Unclear if he discussed chronic diarrhea with GI.    Checking labs, checking stool for C. difficile and WBCs. Further advised with results. Left knee pain: Request a referral to Dr. Mardelle Matte, will arrange RTC 3 months  I spent more than 20 minutes in addition to CPX assessing his chronic medical problems as well as diarrhea and weight loss.  This visit occurred during the SARS-CoV-2 public health emergency.  Safety protocols were in place, including screening questions prior to the visit, additional usage of staff PPE, and extensive cleaning of exam room while observing appropriate contact time as indicated for disinfecting solutions.

## 2019-12-24 NOTE — Patient Instructions (Addendum)
Please schedule Medicare Wellness with Glenard Haring.     GO TO THE LAB : Get the blood work and get a container to provide stool samples   [place], [place] back for a checkup in 3 months

## 2019-12-24 NOTE — Progress Notes (Signed)
Pre visit review using our clinic review tool, if applicable. No additional management support is needed unless otherwise documented below in the visit note. 

## 2019-12-24 NOTE — Assessment & Plan Note (Signed)
Td 2020 PNM 23: 2020 PNM 13: 2018 S/p shingrix  S/p covid vaccines  Flu shot   rec q year [facility]: Colonoscopy 09/01/2015: + Polyps. Saw GI 08/18/2019, Cscope 08/22/2019: Normal.  No evidence of AVMs, mass, colitis.  + Small hemorrhoids. Prostate Ca screening : sees urology Labs:  CMP, FLP, CBC, TIBC, A1c, TSH, T3, T4, stools for C. difficile and WBCs.

## 2019-12-25 ENCOUNTER — Other Ambulatory Visit: Payer: Self-pay

## 2019-12-25 NOTE — Addendum Note (Signed)
Addended byDamita Dunnings D on: 12/25/2019 04:11 PM   Modules accepted: Orders

## 2019-12-29 DIAGNOSIS — M1712 Unilateral primary osteoarthritis, left knee: Secondary | ICD-10-CM | POA: Diagnosis not present

## 2019-12-30 ENCOUNTER — Other Ambulatory Visit (INDEPENDENT_AMBULATORY_CARE_PROVIDER_SITE_OTHER): Payer: Medicare HMO

## 2019-12-30 ENCOUNTER — Telehealth: Payer: Self-pay

## 2019-12-30 ENCOUNTER — Other Ambulatory Visit: Payer: Self-pay

## 2019-12-30 DIAGNOSIS — R634 Abnormal weight loss: Secondary | ICD-10-CM

## 2019-12-30 DIAGNOSIS — C8510 Unspecified B-cell lymphoma, unspecified site: Secondary | ICD-10-CM | POA: Diagnosis not present

## 2019-12-30 DIAGNOSIS — R197 Diarrhea, unspecified: Secondary | ICD-10-CM | POA: Diagnosis not present

## 2019-12-30 DIAGNOSIS — M1712 Unilateral primary osteoarthritis, left knee: Secondary | ICD-10-CM | POA: Diagnosis present

## 2019-12-30 DIAGNOSIS — Z862 Personal history of diseases of the blood and blood-forming organs and certain disorders involving the immune mechanism: Secondary | ICD-10-CM | POA: Diagnosis not present

## 2019-12-30 NOTE — Telephone Encounter (Signed)
Surgical clearance received from [facility]. Pt needing L partial knee replacement scheduled 02/03/2020 with Dr. Mardelle Matte. Last OV 12/24/2019. Please let me know if surgical clearance appt is needed.

## 2019-12-31 NOTE — Telephone Encounter (Signed)
Advise patient, needs to postpone surgery until he is seen by endocrinology regards Hyperthyroidism.  He was already refer, recommend to schedule that appointment

## 2019-12-31 NOTE — Telephone Encounter (Signed)
Received fax confirmation

## 2019-12-31 NOTE — Telephone Encounter (Signed)
Mychart message sent to Pt make him aware that PCP is not releasing him for surgery at this time. Gave him number to call endo to schedule. Surgical clearance form faxed back to Raliegh Ip at [phone] ATTN: Sherri informing of above. Form sent for scanning.

## 2020-01-02 LAB — CLOSTRIDIUM DIFFICILE TOXIN B, QUALITATIVE, REAL-TIME PCR: Toxigenic C. Difficile by PCR: NOT DETECTED

## 2020-01-02 LAB — FECAL LACTOFERRIN, QUANT
Fecal Lactoferrin: NEGATIVE
MICRO NUMBER:: 10781563
SPECIMEN QUALITY:: ADEQUATE

## 2020-01-13 ENCOUNTER — Encounter: Payer: Self-pay | Admitting: Internal Medicine

## 2020-01-13 ENCOUNTER — Ambulatory Visit: Payer: Medicare HMO | Admitting: Internal Medicine

## 2020-01-13 ENCOUNTER — Other Ambulatory Visit: Payer: Self-pay

## 2020-01-13 VITALS — BP 116/70 | HR 58 | Ht 68.0 in | Wt 153.6 lb

## 2020-01-13 DIAGNOSIS — E039 Hypothyroidism, unspecified: Secondary | ICD-10-CM

## 2020-01-13 DIAGNOSIS — E079 Disorder of thyroid, unspecified: Secondary | ICD-10-CM | POA: Diagnosis not present

## 2020-01-13 LAB — TSH: TSH: 8.86 u[IU]/mL — ABNORMAL HIGH (ref 0.35–4.50)

## 2020-01-13 LAB — T4, FREE: Free T4: 0.47 ng/dL — ABNORMAL LOW (ref 0.60–1.60)

## 2020-01-13 MED ORDER — LEVOTHYROXINE SODIUM 50 MCG PO TABS: 50.0000 ug | ORAL_TABLET | Freq: Every day | ORAL | 6 refills | Status: DC

## 2020-01-13 NOTE — Progress Notes (Signed)
Name: Kyle Wise  MRN/ DOB: [id number], March 15, 1941    Age/ Sex: 79 y.o., male    PCP: Colon Branch, MD   Reason for Endocrinology Evaluation: Hypothyroidism     Date of Initial Endocrinology Evaluation: 01/13/2020     HPI: Kyle Wise is a 79 y.o. male with a past medical history of Osteoporosis, low grade B-cell Lymphoma, asthma and dyslipidemia . Kyle Wise presented for initial endocrinology clinic visit on 01/13/2020 for consultative assistance with his hyperthyroidism     Kyle Wise is accompanied by his spouse today  Pt is here for surgical clearance for left partial knee replacement on 9/7th   Kyle pt has been diagnosed with hypothyroidism in 1990 through a holistic doctor, over Kyle years, he has been on armour thyroid and as of recently, has been switched to NP thyroid    Pt was noted to have a suppressed TSH since 2019 with a nadir of < 0.008 uIU/mL, but was continued on NP thyroid through Holistic doctor until this was stopped by his PCP on 7/29 in preparation for his knee surgery.     He was losing weight at some point but weight has been steady  Denies palpitations Has chronic diarrhea  Denies irritability    HISTORY:  Past Medical History:  Past Medical History:  Diagnosis Date  . Allergy    seasonal  . Anemia   . Arthritis   . Asthma   . Frequent UTI   . History of colon polyps   . Hyperlipidemia   . Legal blindness   . Low grade B-cell lymphoma ([place])   . Osteoporosis    Past Surgical History:  Past Surgical History:  Procedure Laterality Date  . APPENDECTOMY  1999  . BRAIN SURGERY  12/2008   meningioma   . COLON SURGERY  1999   colon resection  . EYE SURGERY     cataract  . KNEE ARTHROSCOPY Left 05/2009  . PROSTATE ABLATION  2009   "microwave procedure", Dr Hazle Nordmann  . TONSILLECTOMY AND ADENOIDECTOMY  1945    Social History:  reports that he has never smoked. He has never used smokeless tobacco. He reports current alcohol use. He  reports that he does not use drugs. Family History: family history includes Arthritis in his brother, father, and mother; Asthma in his brother; CAD in his maternal grandfather and mother; Cancer in his mother and paternal grandmother; Colon cancer in his father; Diabetes in his maternal grandmother; Heart attack in his maternal grandfather and mother.   HOME MEDICATIONS: Allergies as of 01/13/2020   No Known Allergies     Medication List       Accurate as of January 13, 2020 11:59 PM. If you have any questions, ask your nurse or doctor.        STOP taking these medications   Magnesium 200 MG Tabs Stopped by: Dorita Sciara, MD     TAKE these medications   levothyroxine 50 MCG tablet Commonly known as: SYNTHROID Take 1 tablet (50 mcg total) by mouth daily. Started by: Dorita Sciara, MD   melatonin 5 MG Tabs Take 5 mg by mouth at bedtime. What changed: Another medication with Kyle same name was removed. Continue taking this medication, and follow Kyle directions you see here. Changed by: Dorita Sciara, MD   multivitamin with minerals Tabs tablet Take 1 tablet by mouth daily.   NALTREXONE HCL PO Take 4.5 mg by mouth daily. Take  1 tablet by mouth once daily.   tamsulosin 0.4 MG Caps capsule Commonly known as: FLOMAX Take 0.4 mg by mouth 2 (two) times daily.         REVIEW OF SYSTEMS: A comprehensive ROS was conducted with Kyle Wise and is negative except as per HPI   OBJECTIVE:  VS: BP 116/70 (BP Location: Right Arm, Wise Position: Sitting, Cuff Size: Normal)   Pulse (!) 58   Ht 5\' 8"  (1.727 m)   Wt 153 lb 9.6 oz (69.7 kg)   SpO2 98%   BMI 23.35 kg/m    Wt Readings from Last 3 Encounters:  01/13/20 153 lb 9.6 oz (69.7 kg)  12/24/19 146 lb (66.2 kg)  08/19/19 156 lb 8 oz (71 kg)     EXAM: General: Pt appears well and is in NAD  Neck: General: Supple without adenopathy. Thyroid: Thyroid size normal.  No goiter or nodules  appreciated. No thyroid bruit.  Lungs: Clear with good BS bilat with no rales, rhonchi, or wheezes  Heart: Auscultation: RRR.  Abdomen: Normoactive bowel sounds, soft, nontender, without masses or organomegaly palpable  Extremities:  BL LE: No pretibial edema normal ROM and strength.  Skin: Hair: Texture and amount normal with gender appropriate distribution Skin Inspection: No rashes Skin Palpation: Skin temperature, texture, and thickness normal to palpation  Neuro: Cranial nerves: II - XII grossly intact  Motor: Normal strength throughout DTRs: 2+ and symmetric in UE without delay in relaxation phase  Mental Status: Judgment, insight: Intact Orientation: Oriented to time, place, and person Mood and affect: No depression, anxiety, or agitation     DATA REVIEWED: Results for IKEY, OMARY (MRN [id number]) as of 01/14/2020 08:57  Ref. Range 12/24/2019 10:44 01/13/2020 10:49  TSH Latest Ref Range: 0.35 - 4.50 uIU/mL <0.01 (L) 8.86 (H)  Triiodothyronine,Free,Serum Latest Ref Range: 2.3 - 4.2 pg/mL 4.8 (H)   T4,Free(Direct) Latest Ref Range: 0.60 - 1.60 ng/dL 0.94 0.47 (L)    ASSESSMENT/PLAN/RECOMMENDATIONS:   Hypothyroidism:   - Pt has been off NP thyroid for Kyle past 3 weeks, historically had iatrogenic hyperthyroidism through treatment with holistic medicine. I have explained to him Kyle increased risk of cardiac arrhythmia, morbidity, mortality and bone resorption  With hyperthyroidism ( whether endogenous or iatrogenic)  - Given repeat TSH is now elevated at 8.86 uIU/mL , will start him on LT-4 replacement as below -  I have advised Kyle Wise for my preference to go with Levothyroxine rather then dessicated thyroid, due to more stability with T4 content in levothyroxine and also armour thyroid has non-physiologic levels of T4:T3 of  4:1 (physiologic levels 13:1 to 16:1)  - Pt is cleared for surgery from Kyle endocrine stand point  Medications : Start Levothyroxine 50 mcg daily    Labs Kyle week before surgery   Addendum: labs discussed with Kyle pt on 01/13/2020 @ 1630   Signed electronically by: Mack Guise, MD  [facility] Endocrinology  [address] [place], [place] [address] Phone: [phone] FAX: [phone]   CC: Colon Branch, [address] [place] [address] Phone: [phone] Fax: [phone]   Return to Endocrinology clinic as below: Future Appointments  Date Time Provider [place]  01/27/2020  9:00 AM [facility] PAT 2 [facility] None  01/29/2020  8:00 AM [facility] LAB [facility]  03/25/2020  9:40 AM Colon Branch, MD [facility]  04/14/2020  9:30 AM Ivery Nanney, Melanie Crazier, MD  [facility]

## 2020-01-13 NOTE — Patient Instructions (Signed)
-   PLease stop by the lab today

## 2020-01-14 DIAGNOSIS — E039 Hypothyroidism, unspecified: Secondary | ICD-10-CM | POA: Insufficient documentation

## 2020-01-14 NOTE — Telephone Encounter (Signed)
Advise patient: He is clear as long as he feels better, was having diarrhea and weight loss. Also endocrinology recommended a TSH 1 week before surgery. Okay to write a letter for clearance.

## 2020-01-14 NOTE — Telephone Encounter (Signed)
Received fax confirmation

## 2020-01-14 NOTE — Telephone Encounter (Signed)
Spoke w/ Pt- still having some diarrhea but weight loss has stabilized. Letter for clearance faxed to [employer].

## 2020-01-14 NOTE — Telephone Encounter (Signed)
Pt seen by endo on 01/13/2020 and was cleared for surgery from endo standpoint only. Received another request from Raliegh Ip requesting medical and cardiac clearance please.

## 2020-01-19 NOTE — Care Plan (Signed)
Ortho Bundle Case Management Note  Patient Details  Name: Kyle Wise MRN: [id number] Date of Birth: [birth year]   spoke with patient prior to surgery. He will discharge to home with family to assist. Has equipment at home. OPPT set up at [facility]. Patient and MD in agreement with plan. Choice offered.                  DME Arranged:    DME Agency:     HH Arranged:    HH Agency:     Additional Comments: Please contact me with any questions of if this plan should need to change.  Ladell Heads,  [place] [facility]ist  [phone] 01/19/2020, 1:09 PM

## 2020-01-26 ENCOUNTER — Other Ambulatory Visit: Payer: Self-pay

## 2020-01-26 ENCOUNTER — Encounter (HOSPITAL_BASED_OUTPATIENT_CLINIC_OR_DEPARTMENT_OTHER): Payer: Self-pay | Admitting: Orthopedic Surgery

## 2020-01-27 ENCOUNTER — Encounter (HOSPITAL_COMMUNITY): Admission: RE | Admit: 2020-01-27 | Payer: Medicare HMO | Source: Ambulatory Visit

## 2020-01-27 ENCOUNTER — Encounter (HOSPITAL_COMMUNITY): Payer: Self-pay

## 2020-01-28 NOTE — Addendum Note (Signed)
Addended by: Kelle Darting A on: 01/28/2020 01:33 PM   Modules accepted: Orders

## 2020-01-29 ENCOUNTER — Other Ambulatory Visit: Payer: Medicare HMO

## 2020-01-29 ENCOUNTER — Other Ambulatory Visit: Payer: Self-pay

## 2020-01-29 DIAGNOSIS — E039 Hypothyroidism, unspecified: Secondary | ICD-10-CM | POA: Diagnosis not present

## 2020-01-29 LAB — T4, FREE: Free T4: 1.2 ng/dL (ref 0.8–1.8)

## 2020-01-29 LAB — TSH: TSH: 3.15 mIU/L (ref 0.40–4.50)

## 2020-01-30 ENCOUNTER — Encounter (HOSPITAL_BASED_OUTPATIENT_CLINIC_OR_DEPARTMENT_OTHER)
Admission: RE | Admit: 2020-01-30 | Discharge: 2020-01-30 | Disposition: A | Discharge status: Home / Self Care | Payer: Medicare HMO | Source: Ambulatory Visit | Attending: Orthopedic Surgery | Admitting: Orthopedic Surgery

## 2020-01-30 ENCOUNTER — Other Ambulatory Visit (HOSPITAL_COMMUNITY)
Admission: RE | Admit: 2020-01-30 | Discharge: 2020-01-30 | Disposition: A | Discharge status: Home / Self Care | Payer: Medicare HMO | Source: Ambulatory Visit | Attending: Orthopedic Surgery | Admitting: Orthopedic Surgery

## 2020-01-30 DIAGNOSIS — Z01812 Encounter for preprocedural laboratory examination: Secondary | ICD-10-CM | POA: Insufficient documentation

## 2020-01-30 DIAGNOSIS — Z20822 Contact with and (suspected) exposure to covid-19: Secondary | ICD-10-CM | POA: Diagnosis not present

## 2020-01-30 DIAGNOSIS — Z01818 Encounter for other preprocedural examination: Secondary | ICD-10-CM | POA: Insufficient documentation

## 2020-01-30 LAB — SURGICAL PCR SCREEN
MRSA, PCR: NEGATIVE
Staphylococcus aureus: NEGATIVE

## 2020-01-30 LAB — SARS CORONAVIRUS 2 (TAT 6-24 HRS): SARS Coronavirus 2: NEGATIVE

## 2020-01-30 LAB — CBC
HCT: 50.8 % (ref 39.0–52.0)
Hemoglobin: 16.2 g/dL (ref 13.0–17.0)
MCH: 26.8 pg (ref 26.0–34.0)
MCHC: 31.9 g/dL (ref 30.0–36.0)
MCV: 84.1 fL (ref 80.0–100.0)
Platelets: 159 10*3/uL (ref 150–400)
RBC: 6.04 MIL/uL — ABNORMAL HIGH (ref 4.22–5.81)
RDW: 14.3 % (ref 11.5–15.5)
WBC: 5.7 10*3/uL (ref 4.0–10.5)
nRBC: 0 % (ref 0.0–0.2)

## 2020-01-30 NOTE — Progress Notes (Addendum)

## 2020-01-31 ENCOUNTER — Other Ambulatory Visit (HOSPITAL_COMMUNITY): Payer: Medicare HMO

## 2020-02-03 ENCOUNTER — Other Ambulatory Visit: Payer: Self-pay

## 2020-02-03 ENCOUNTER — Ambulatory Visit (HOSPITAL_COMMUNITY): Payer: Medicare HMO

## 2020-02-03 ENCOUNTER — Encounter (HOSPITAL_BASED_OUTPATIENT_CLINIC_OR_DEPARTMENT_OTHER): Payer: Self-pay | Admitting: Orthopedic Surgery

## 2020-02-03 ENCOUNTER — Other Ambulatory Visit (HOSPITAL_COMMUNITY): Payer: Medicare HMO

## 2020-02-03 ENCOUNTER — Ambulatory Visit (HOSPITAL_BASED_OUTPATIENT_CLINIC_OR_DEPARTMENT_OTHER): Payer: Medicare HMO | Admitting: Anesthesiology

## 2020-02-03 ENCOUNTER — Ambulatory Visit (HOSPITAL_BASED_OUTPATIENT_CLINIC_OR_DEPARTMENT_OTHER)
Admission: RE | Admit: 2020-02-03 | Discharge: 2020-02-04 | Disposition: A | Discharge status: Home / Self Care | Payer: Medicare HMO | Attending: Orthopedic Surgery | Admitting: Orthopedic Surgery

## 2020-02-03 ENCOUNTER — Encounter (HOSPITAL_BASED_OUTPATIENT_CLINIC_OR_DEPARTMENT_OTHER)
Admission: RE | Disposition: A | Discharge status: Home / Self Care | Payer: Self-pay | Source: Home / Self Care | Attending: Orthopedic Surgery

## 2020-02-03 DIAGNOSIS — Z96652 Presence of left artificial knee joint: Secondary | ICD-10-CM

## 2020-02-03 DIAGNOSIS — H548 Legal blindness, as defined in USA: Secondary | ICD-10-CM | POA: Insufficient documentation

## 2020-02-03 DIAGNOSIS — Z79899 Other long term (current) drug therapy: Secondary | ICD-10-CM | POA: Insufficient documentation

## 2020-02-03 DIAGNOSIS — Z833 Family history of diabetes mellitus: Secondary | ICD-10-CM | POA: Diagnosis not present

## 2020-02-03 DIAGNOSIS — Z832 Family history of diseases of the blood and blood-forming organs and certain disorders involving the immune mechanism: Secondary | ICD-10-CM | POA: Insufficient documentation

## 2020-02-03 DIAGNOSIS — Z8572 Personal history of non-Hodgkin lymphomas: Secondary | ICD-10-CM | POA: Insufficient documentation

## 2020-02-03 DIAGNOSIS — M810 Age-related osteoporosis without current pathological fracture: Secondary | ICD-10-CM | POA: Insufficient documentation

## 2020-02-03 DIAGNOSIS — Z86010 Personal history of colonic polyps: Secondary | ICD-10-CM | POA: Insufficient documentation

## 2020-02-03 DIAGNOSIS — E039 Hypothyroidism, unspecified: Secondary | ICD-10-CM | POA: Diagnosis not present

## 2020-02-03 DIAGNOSIS — D649 Anemia, unspecified: Secondary | ICD-10-CM | POA: Insufficient documentation

## 2020-02-03 DIAGNOSIS — M25762 Osteophyte, left knee: Secondary | ICD-10-CM | POA: Diagnosis not present

## 2020-02-03 DIAGNOSIS — G8918 Other acute postprocedural pain: Secondary | ICD-10-CM | POA: Diagnosis not present

## 2020-02-03 DIAGNOSIS — M1712 Unilateral primary osteoarthritis, left knee: Secondary | ICD-10-CM | POA: Diagnosis not present

## 2020-02-03 DIAGNOSIS — E785 Hyperlipidemia, unspecified: Secondary | ICD-10-CM | POA: Insufficient documentation

## 2020-02-03 DIAGNOSIS — Z809 Family history of malignant neoplasm, unspecified: Secondary | ICD-10-CM | POA: Diagnosis not present

## 2020-02-03 DIAGNOSIS — Z8261 Family history of arthritis: Secondary | ICD-10-CM | POA: Insufficient documentation

## 2020-02-03 DIAGNOSIS — Z800 Family history of malignant neoplasm of digestive organs: Secondary | ICD-10-CM | POA: Diagnosis not present

## 2020-02-03 DIAGNOSIS — Z825 Family history of asthma and other chronic lower respiratory diseases: Secondary | ICD-10-CM | POA: Diagnosis not present

## 2020-02-03 DIAGNOSIS — R600 Localized edema: Secondary | ICD-10-CM | POA: Diagnosis not present

## 2020-02-03 DIAGNOSIS — F419 Anxiety disorder, unspecified: Secondary | ICD-10-CM | POA: Diagnosis not present

## 2020-02-03 DIAGNOSIS — Z9889 Other specified postprocedural states: Secondary | ICD-10-CM | POA: Diagnosis not present

## 2020-02-03 DIAGNOSIS — Z471 Aftercare following joint replacement surgery: Secondary | ICD-10-CM | POA: Diagnosis not present

## 2020-02-03 DIAGNOSIS — Z87440 Personal history of urinary (tract) infections: Secondary | ICD-10-CM | POA: Insufficient documentation

## 2020-02-03 DIAGNOSIS — D638 Anemia in other chronic diseases classified elsewhere: Secondary | ICD-10-CM | POA: Diagnosis not present

## 2020-02-03 HISTORY — DX: Hypothyroidism, unspecified: E03.9

## 2020-02-03 HISTORY — DX: Anxiety disorder, unspecified: F41.9

## 2020-02-03 HISTORY — DX: Unilateral primary osteoarthritis, left knee: M17.12

## 2020-02-03 HISTORY — PX: PARTIAL KNEE ARTHROPLASTY: SHX2174

## 2020-02-03 SURGERY — ARTHROPLASTY, KNEE, UNICOMPARTMENTAL
Anesthesia: Spinal | Site: Knee | Laterality: Left

## 2020-02-03 MED ORDER — BUPIVACAINE HCL (PF) 0.75 % IJ SOLN
INTRAMUSCULAR | Status: DC | PRN
  Administered 2020-02-03: 1.6 mL via INTRATHECAL

## 2020-02-03 MED ORDER — FENTANYL CITRATE (PF) 100 MCG/2ML IJ SOLN
INTRAMUSCULAR | Status: AC
Start: 2020-02-03 — End: ?
  Filled 2020-02-03: qty 2

## 2020-02-03 MED ORDER — PROPOFOL 10 MG/ML IV BOLUS
INTRAVENOUS | Status: DC | PRN
  Administered 2020-02-03 (×2): 20 mg via INTRAVENOUS

## 2020-02-03 MED ORDER — ONDANSETRON HCL 4 MG/2ML IJ SOLN: 4.0000 mg | Freq: Four times a day (QID) | INTRAMUSCULAR | Status: DC | PRN

## 2020-02-03 MED ORDER — MORPHINE SULFATE (PF) 4 MG/ML IV SOLN: 0.5000 mg | INTRAVENOUS | Status: DC | PRN

## 2020-02-03 MED ORDER — FENTANYL CITRATE (PF) 100 MCG/2ML IJ SOLN
50.0000 ug | Freq: Once | INTRAMUSCULAR | Status: AC
  Administered 2020-02-03: 50 ug via INTRAVENOUS

## 2020-02-03 MED ORDER — PROPOFOL 10 MG/ML IV BOLUS
INTRAVENOUS | Status: AC
  Filled 2020-02-03: qty 20

## 2020-02-03 MED ORDER — SODIUM CHLORIDE 0.9 % IV SOLN
INTRAVENOUS | Status: AC | PRN
  Administered 2020-02-03: 500 mL

## 2020-02-03 MED ORDER — POTASSIUM CHLORIDE IN NACL 20-0.45 MEQ/L-% IV SOLN: INTRAVENOUS | Status: DC

## 2020-02-03 MED ORDER — BISACODYL 10 MG RE SUPP: 10.0000 mg | Freq: Every day | RECTAL | Status: DC | PRN

## 2020-02-03 MED ORDER — ACETAMINOPHEN 500 MG PO TABS
1000.0000 mg | ORAL_TABLET | Freq: Once | ORAL | Status: AC
  Administered 2020-02-03: 1000 mg via ORAL

## 2020-02-03 MED ORDER — DEXAMETHASONE SODIUM PHOSPHATE 10 MG/ML IJ SOLN: 10.0000 mg | Freq: Once | INTRAMUSCULAR | Status: DC

## 2020-02-03 MED ORDER — DEXAMETHASONE SODIUM PHOSPHATE 10 MG/ML IJ SOLN
INTRAMUSCULAR | Status: AC
  Filled 2020-02-03: qty 1

## 2020-02-03 MED ORDER — MENTHOL 3 MG MT LOZG: 1.0000 | LOZENGE | OROMUCOSAL | Status: DC | PRN

## 2020-02-03 MED ORDER — OXYCODONE HCL 5 MG/5ML PO SOLN: 5.0000 mg | Freq: Once | ORAL | Status: DC | PRN

## 2020-02-03 MED ORDER — MIDAZOLAM HCL 2 MG/2ML IJ SOLN
INTRAMUSCULAR | Status: AC
  Filled 2020-02-03: qty 2

## 2020-02-03 MED ORDER — ACETAMINOPHEN 500 MG PO TABS
500.0000 mg | ORAL_TABLET | Freq: Four times a day (QID) | ORAL | Status: DC
  Administered 2020-02-03: 500 mg via ORAL
  Filled 2020-02-03: qty 1

## 2020-02-03 MED ORDER — LACTATED RINGERS IV SOLN: INTRAVENOUS | Status: DC

## 2020-02-03 MED ORDER — POVIDONE-IODINE 10 % EX SWAB
2.0000 "application " | Freq: Once | CUTANEOUS | Status: AC
  Administered 2020-02-03: 2 via TOPICAL

## 2020-02-03 MED ORDER — ASPIRIN EC 325 MG PO TBEC: 325.0000 mg | DELAYED_RELEASE_TABLET | Freq: Every day | ORAL | Status: DC

## 2020-02-03 MED ORDER — HYDROCODONE-ACETAMINOPHEN 7.5-325 MG PO TABS: 1.0000 | ORAL_TABLET | ORAL | Status: DC | PRN

## 2020-02-03 MED ORDER — PROPOFOL 500 MG/50ML IV EMUL
INTRAVENOUS | Status: AC
  Filled 2020-02-03: qty 50

## 2020-02-03 MED ORDER — PHENYLEPHRINE HCL (PRESSORS) 10 MG/ML IV SOLN
INTRAVENOUS | Status: AC
  Filled 2020-02-03: qty 1

## 2020-02-03 MED ORDER — DIPHENHYDRAMINE HCL 12.5 MG/5ML PO ELIX: 12.5000 mg | ORAL_SOLUTION | ORAL | Status: DC | PRN

## 2020-02-03 MED ORDER — LEVOTHYROXINE SODIUM 50 MCG PO TABS
50.0000 ug | ORAL_TABLET | Freq: Every day | ORAL | Status: DC
  Administered 2020-02-04: 50 ug via ORAL
  Filled 2020-02-03: qty 1

## 2020-02-03 MED ORDER — MAGNESIUM CITRATE PO SOLN: 1.0000 | Freq: Once | ORAL | Status: DC | PRN

## 2020-02-03 MED ORDER — OXYCODONE HCL 5 MG PO TABS: 5.0000 mg | ORAL_TABLET | Freq: Once | ORAL | Status: DC | PRN

## 2020-02-03 MED ORDER — CEFAZOLIN SODIUM-DEXTROSE 2-4 GM/100ML-% IV SOLN
2.0000 g | INTRAVENOUS | Status: AC
  Administered 2020-02-03: 2 g via INTRAVENOUS

## 2020-02-03 MED ORDER — DOCUSATE SODIUM 100 MG PO CAPS: 100.0000 mg | ORAL_CAPSULE | Freq: Two times a day (BID) | ORAL | Status: DC

## 2020-02-03 MED ORDER — POLYETHYLENE GLYCOL 3350 17 G PO PACK: 17.0000 g | PACK | Freq: Every day | ORAL | Status: DC | PRN

## 2020-02-03 MED ORDER — CEFAZOLIN SODIUM-DEXTROSE 2-4 GM/100ML-% IV SOLN
INTRAVENOUS | Status: AC
  Filled 2020-02-03: qty 100

## 2020-02-03 MED ORDER — ALUM & MAG HYDROXIDE-SIMETH 200-200-20 MG/5ML PO SUSP: 30.0000 mL | ORAL | Status: DC | PRN

## 2020-02-03 MED ORDER — HYDROMORPHONE HCL 1 MG/ML IJ SOLN: 0.2500 mg | INTRAMUSCULAR | Status: DC | PRN

## 2020-02-03 MED ORDER — METOCLOPRAMIDE HCL 5 MG/ML IJ SOLN: 5.0000 mg | Freq: Three times a day (TID) | INTRAMUSCULAR | Status: DC | PRN

## 2020-02-03 MED ORDER — ONDANSETRON HCL 4 MG/2ML IJ SOLN
INTRAMUSCULAR | Status: DC | PRN
  Administered 2020-02-03: 4 mg via INTRAVENOUS

## 2020-02-03 MED ORDER — PHENYLEPHRINE HCL-NACL 10-0.9 MG/250ML-% IV SOLN
INTRAVENOUS | Status: DC | PRN
  Administered 2020-02-03: 50 ug/min via INTRAVENOUS

## 2020-02-03 MED ORDER — ONDANSETRON HCL 4 MG PO TABS: 4.0000 mg | ORAL_TABLET | Freq: Four times a day (QID) | ORAL | Status: DC | PRN

## 2020-02-03 MED ORDER — HYDROCODONE-ACETAMINOPHEN 5-325 MG PO TABS
1.0000 | ORAL_TABLET | ORAL | Status: DC | PRN
  Administered 2020-02-04 (×2): 1 via ORAL
  Filled 2020-02-03 (×2): qty 1

## 2020-02-03 MED ORDER — ACETAMINOPHEN 325 MG PO TABS: 325.0000 mg | ORAL_TABLET | Freq: Four times a day (QID) | ORAL | Status: DC | PRN

## 2020-02-03 MED ORDER — ACETAMINOPHEN 500 MG PO TABS
ORAL_TABLET | ORAL | Status: AC
  Filled 2020-02-03: qty 2

## 2020-02-03 MED ORDER — PHENOL 1.4 % MT LIQD: 1.0000 | OROMUCOSAL | Status: DC | PRN

## 2020-02-03 MED ORDER — TRANEXAMIC ACID-NACL 1000-0.7 MG/100ML-% IV SOLN
INTRAVENOUS | Status: AC
  Filled 2020-02-03: qty 100

## 2020-02-03 MED ORDER — PROPOFOL 500 MG/50ML IV EMUL
INTRAVENOUS | Status: DC | PRN
  Administered 2020-02-03: 50 ug/kg/min via INTRAVENOUS

## 2020-02-03 MED ORDER — TAMSULOSIN HCL 0.4 MG PO CAPS
0.4000 mg | ORAL_CAPSULE | Freq: Two times a day (BID) | ORAL | Status: DC
  Administered 2020-02-03: 0.4 mg via ORAL
  Filled 2020-02-03: qty 1

## 2020-02-03 MED ORDER — TRANEXAMIC ACID-NACL 1000-0.7 MG/100ML-% IV SOLN
1000.0000 mg | INTRAVENOUS | Status: AC
  Administered 2020-02-03: 1000 mg via INTRAVENOUS

## 2020-02-03 MED ORDER — MELATONIN 5 MG PO TABS
5.0000 mg | ORAL_TABLET | Freq: Every day | ORAL | Status: DC
  Administered 2020-02-03: 5 mg via ORAL
  Filled 2020-02-03: qty 1

## 2020-02-03 MED ORDER — ROPIVACAINE HCL 5 MG/ML IJ SOLN
INTRAMUSCULAR | Status: DC | PRN
  Administered 2020-02-03: 20 mL via PERINEURAL

## 2020-02-03 MED ORDER — METOCLOPRAMIDE HCL 5 MG PO TABS: 5.0000 mg | ORAL_TABLET | Freq: Three times a day (TID) | ORAL | Status: DC | PRN

## 2020-02-03 MED ORDER — PROMETHAZINE HCL 25 MG/ML IJ SOLN: 6.2500 mg | INTRAMUSCULAR | Status: DC | PRN

## 2020-02-03 MED ORDER — ONDANSETRON HCL 4 MG/2ML IJ SOLN
INTRAMUSCULAR | Status: AC
  Filled 2020-02-03: qty 2

## 2020-02-03 MED ORDER — MIDAZOLAM HCL 2 MG/2ML IJ SOLN
1.0000 mg | Freq: Once | INTRAMUSCULAR | Status: AC
  Administered 2020-02-03: 1 mg via INTRAVENOUS

## 2020-02-03 MED ORDER — CEFAZOLIN SODIUM-DEXTROSE 2-4 GM/100ML-% IV SOLN
2.0000 g | Freq: Four times a day (QID) | INTRAVENOUS | Status: AC
  Administered 2020-02-03: 2 g via INTRAVENOUS
  Filled 2020-02-03: qty 100

## 2020-02-03 SURGICAL SUPPLY — 78 items
BANDAGE ESMARK 6X9 LF (GAUZE/BANDAGES/DRESSINGS) ×1 IMPLANT
BEARING MENISCAL TIBIAL 5 MD L (Orthopedic Implant) ×3 IMPLANT
BLADE SURG 10 STRL SS (BLADE) ×6 IMPLANT
BLADE SURG 15 STRL LF DISP TIS (BLADE) ×1 IMPLANT
BLADE SURG 15 STRL SS (BLADE) ×3
BNDG CMPR 9X6 STRL LF SNTH (GAUZE/BANDAGES/DRESSINGS) ×1
BNDG ELASTIC 6X5.8 VLCR STR LF (GAUZE/BANDAGES/DRESSINGS) ×3 IMPLANT
BNDG ESMARK 6X9 LF (GAUZE/BANDAGES/DRESSINGS) ×3
BOWL SMART MIX CTS (DISPOSABLE) ×3 IMPLANT
BRNG TIB MED 5 PHS 3 LT MEN (Orthopedic Implant) ×1 IMPLANT
CANISTER SUCT 1200ML W/VALVE (MISCELLANEOUS) IMPLANT
CEMENT BONE R 1X40 (Cement) ×3 IMPLANT
CLOSURE STERI-STRIP 1/2X4 (GAUZE/BANDAGES/DRESSINGS) ×1
CLSR STERI-STRIP ANTIMIC 1/2X4 (GAUZE/BANDAGES/DRESSINGS) ×2 IMPLANT
COVER BACK TABLE 60X90IN (DRAPES) ×3 IMPLANT
COVER WAND RF STERILE (DRAPES) IMPLANT
CUFF TOURN SGL QUICK 34 (TOURNIQUET CUFF) ×3
CUFF TRNQT CYL 34X4.125X (TOURNIQUET CUFF) ×1 IMPLANT
DECANTER SPIKE VIAL GLASS SM (MISCELLANEOUS) IMPLANT
DRAPE EXTREMITY T 121X128X90 (DISPOSABLE) ×3 IMPLANT
DRAPE IMP U-DRAPE 54X76 (DRAPES) ×3 IMPLANT
DRAPE U-SHAPE 47X51 STRL (DRAPES) ×3 IMPLANT
DRSG MEPILEX BORDER 4X8 (GAUZE/BANDAGES/DRESSINGS) ×3 IMPLANT
DURAPREP 26ML APPLICATOR (WOUND CARE) ×3 IMPLANT
ELECT REM PT RETURN 9FT ADLT (ELECTROSURGICAL) ×3
ELECTRODE REM PT RTRN 9FT ADLT (ELECTROSURGICAL) ×1 IMPLANT
FACESHIELD WRAPAROUND (MASK) IMPLANT
GLOVE BIO SURGEON STRL SZ 6.5 (GLOVE) ×4 IMPLANT
GLOVE BIO SURGEON STRL SZ7 (GLOVE) ×3 IMPLANT
GLOVE BIO SURGEONS STRL SZ 6.5 (GLOVE) ×2
GLOVE BIOGEL PI IND STRL 6.5 (GLOVE) ×2 IMPLANT
GLOVE BIOGEL PI IND STRL 7.0 (GLOVE) ×4 IMPLANT
GLOVE BIOGEL PI IND STRL 8 (GLOVE) ×1 IMPLANT
GLOVE BIOGEL PI INDICATOR 6.5 (GLOVE) ×4
GLOVE BIOGEL PI INDICATOR 7.0 (GLOVE) ×8
GLOVE BIOGEL PI INDICATOR 8 (GLOVE) ×2
GLOVE ECLIPSE 6.5 STRL STRAW (GLOVE) ×3 IMPLANT
GLOVE ORTHO TXT STRL SZ7.5 (GLOVE) ×3 IMPLANT
GOWN STRL REUS W/ TWL LRG LVL3 (GOWN DISPOSABLE) ×3 IMPLANT
GOWN STRL REUS W/ TWL XL LVL3 (GOWN DISPOSABLE) ×2 IMPLANT
GOWN STRL REUS W/TWL LRG LVL3 (GOWN DISPOSABLE) ×9
GOWN STRL REUS W/TWL XL LVL3 (GOWN DISPOSABLE) ×6
HANDPIECE INTERPULSE COAX TIP (DISPOSABLE) ×3
HOOD PEEL AWAY FLYTE STAYCOOL (MISCELLANEOUS) ×6 IMPLANT
IMMOBILIZER KNEE 22 UNIV (SOFTGOODS) ×3 IMPLANT
IMMOBILIZER KNEE 24 THIGH 36 (MISCELLANEOUS) IMPLANT
IMMOBILIZER KNEE 24 UNIV (MISCELLANEOUS)
KNEE WRAP E Z 3 GEL PACK (MISCELLANEOUS) ×3 IMPLANT
MANIFOLD NEPTUNE II (INSTRUMENTS) ×3 IMPLANT
NS IRRIG 1000ML POUR BTL (IV SOLUTION) ×3 IMPLANT
PACK ARTHROSCOPY DSU (CUSTOM PROCEDURE TRAY) ×3 IMPLANT
PACK BASIN DAY SURGERY FS (CUSTOM PROCEDURE TRAY) ×3 IMPLANT
PACK BLADE SAW RECIP 70 3 PT (BLADE) ×3 IMPLANT
PEG TWIN FEM CEMENTED MED (Knees) ×3 IMPLANT
PENCIL SMOKE EVACUATOR (MISCELLANEOUS) ×3 IMPLANT
SET HNDPC FAN SPRY TIP SCT (DISPOSABLE) ×1 IMPLANT
SHEET MEDIUM DRAPE 40X70 STRL (DRAPES) ×3 IMPLANT
SLEEVE SCD COMPRESS KNEE MED (MISCELLANEOUS) ×3 IMPLANT
SPONGE LAP 18X18 RF (DISPOSABLE) ×6 IMPLANT
SUCTION FRAZIER HANDLE 10FR (MISCELLANEOUS) ×2
SUCTION TUBE FRAZIER 10FR DISP (MISCELLANEOUS) ×1 IMPLANT
SUT MNCRL AB 4-0 PS2 18 (SUTURE) IMPLANT
SUT VIC AB 0 CT1 27 (SUTURE) ×3
SUT VIC AB 0 CT1 27XBRD ANBCTR (SUTURE) ×1 IMPLANT
SUT VIC AB 1 CT1 27 (SUTURE) ×3
SUT VIC AB 1 CT1 27XBRD ANBCTR (SUTURE) ×1 IMPLANT
SUT VIC AB 2-0 SH 27 (SUTURE) ×3
SUT VIC AB 2-0 SH 27XBRD (SUTURE) ×1 IMPLANT
SUT VIC AB 3-0 SH 27 (SUTURE) ×3
SUT VIC AB 3-0 SH 27X BRD (SUTURE) ×1 IMPLANT
SUT VICRYL 3-0 CR8 SH (SUTURE) IMPLANT
SUT VICRYL 4-0 PS2 18IN ABS (SUTURE) IMPLANT
SYR BULB IRRIG 60ML STRL (SYRINGE) ×3 IMPLANT
TOWEL GREEN STERILE FF (TOWEL DISPOSABLE) ×9 IMPLANT
TRAY TIBIAL OXFORD SZ E LEFT (Joint) ×3 IMPLANT
TUBE CONNECTING 20'X1/4 (TUBING)
TUBE CONNECTING 20X1/4 (TUBING) IMPLANT
YANKAUER SUCT BULB TIP NO VENT (SUCTIONS) ×3 IMPLANT

## 2020-02-03 NOTE — Anesthesia Procedure Notes (Signed)
Anesthesia Regional Block: Adductor canal block   Pre-Anesthetic Checklist: ,, timeout performed, Correct Patient, Correct Site, Correct Laterality, Correct Procedure, Correct Position, site marked, Risks and benefits discussed,  Surgical consent,  Pre-op evaluation,  At surgeon's request and post-op pain management  Laterality: Left  Prep: chloraprep       Needles:  Injection technique: Single-shot  Needle Type: Stimiplex     Needle Length: 9cm  Needle Gauge: 21     Additional Needles:   Procedures:,,,, ultrasound used (permanent image in chart),,,,  Narrative:  Start time: 02/03/2020 12:15 PM End time: 02/03/2020 12:20 PM Injection made incrementally with aspirations every 5 mL.  Performed by: Personally  Anesthesiologist: Lynda Rainwater, MD

## 2020-02-03 NOTE — Op Note (Signed)
02/03/2020  2:26 PM  PATIENT:  Kyle Wise    PRE-OPERATIVE DIAGNOSIS:  Left knee anteromedial osteoarthritis  POST-OPERATIVE DIAGNOSIS:  Same  PROCEDURE:  LEFT Unicompartmental Knee Arthroplasty  SURGEON:  [name], MD  PHYSICIAN ASSISTANT: Merlene Pulling, PA-C, present and scrubbed throughout the case, critical for completion in a timely fashion, and for retraction, instrumentation, and closure.  ANESTHESIA:   Spinal plus adductor block  ESTIMATED BLOOD LOSS: 175ml  UNIQUE ASPECTS OF THE CASE: He had a substantial amount of eburnated bone.  My tibial cut was just a sliver of bone.  He felt like a 5 upon the initial gap.  I had a fairly difficult time balancing the knee, and at 1 point I was worried that the femoral post had some toggle to it, although it seemed like it was stable with final trials.  I initially measured the gaps at 4 in flexion, although it was a loose 4, and I could barely get a 1 in extension, so I reamed with a 3, but he was still too tight in extension.  I went back and reamed with a 4, and after reaming with the 4, I felt like the flexion was more like a 5.  Then I reamed up to a 5, but I was still asymmetric, and the extension gap was still tight.  So ultimately I used a 6 spigot, which finally produced a balanced flexion and extension gap, with a 5 poly-, which did feel appropriate.  He was very tight in extension throughout the case, and I think he had lost extension with the severity of his arthritis, he likely had a significant preoperative flexion contracture, during the cementing process, after pressurizing with a 5 I got a fairly significant amount of cement out of the posterior aspect.  I then placed the real polyethylene, and it tracked well, I hope there was not more posterior cement extrusion, although I could not see back very to tell, but there was no interference or pick up with the movement of the polyethylene throughout the functional range of motion.   He was still slightly tight in extension, but he did reach full extension.  On the sizing of the tibia, I was between a size D and a size E, the size E filled the front to back dimension, and overhung just slightly on the posterior medial, but not on the anteromedial aspect.  Therefore, this was the prosthesis that I ultimately selected.  On the sizing of the femur, he actually almost felt like a small, although I think this was because he had so much bone loss, and based on his height, I used a medium.  PREOPERATIVE INDICATIONS:  Kyle Wise is a  79 y.o. male with a diagnosis of djd left knee who failed conservative measures and elected for surgical management.    The risks benefits and alternatives were discussed with the patient preoperatively including but not limited to the risks of infection, bleeding, nerve injury, cardiopulmonary complications, blood clots, the need for revision surgery, among others, and the patient was willing to proceed.  OPERATIVE IMPLANTS: Biomet Oxford mobile bearing medial compartment arthroplasty femur size medium, tibia size E, bearing size 5.  OPERATIVE FINDINGS: Endstage grade 4 medial compartment osteoarthritis. No significant changes in the lateral or patellofemoral joint.  The ACL was intact.  OPERATIVE PROCEDURE: The patient was brought to the operating room placed in the supine position.  Spinal anesthesia was administered. IV antibiotics were given. The lower  extremity was placed in the legholder and prepped and draped in usual sterile fashion.  Time out was performed.  The leg was elevated and exsanguinated and the tourniquet was inflated. Anteromedial incision was performed, and I took care to preserve the MCL. Parapatellar incision was carried out, and the osteophytes were excised, along with the medial meniscus and a small portion of the fat pad.  The extra medullary tibial cutting jig was applied, using the spoon and the 52mm G-Clamp and the 2 mm  shim, and I took care to protect the anterior cruciate ligament insertion and the tibial spine. The medial collateral ligament was also protected, and I resected my proximal tibia, matching the anatomic slope.  The tibial cut was extremely thin.  The proximal tibial bony cut was removed in one piece, and I turned my attention to the femur.  The intramedullary femoral rod was placed using the drill, and then using the appropriate reference, I assembled the femoral jig, setting my posterior cutting block. I resected my posterior femur, used the 0 spigot for the anterior femur, and then measured my gap.  I went through a complicated series of adjustments to ultimately achieve symmetric gaps, as indicated above.  I then completed the preparation of the femur.   I milled off the anterior aspect of the distal femur to prevent impingement.  I did this twice in fact, because I did not feel like the instrument was letting the full depth of the ream progress, although I believe that it ultimately did, and also because during my trialing process I still felt tight in extension, and went back and took an additional millimeter using the 6 spigot after I had already prepared the femur, so I actually prepared the femur twice.    I also exposed the tibia, and selected the above-named component, and then used the cutting jig to prepare the keel slot on the tibia. I also used the awl to curette out the bone to complete the preparation of the keel. The back wall was intact.  I then placed trial components, and it was found to have excellent motion, and appropriate balance.  I then cemented the components into place, cementing the tibia first, removing all excess cement, and then cementing the femur.  All loose cement was removed.  The real polyethylene insert was applied manually, and the knee was taken through functional range of motion, and found to have excellent stability and restoration of joint motion, with  excellent balance.  The wounds were irrigated copiously, and the parapatellar tissue closed with Vicryl, followed by Vicryl for the subcutaneous tissue, with routine closure with Steri-Strips and sterile gauze.  The tourniquet was released, and the patient was awakened and returned to PACU in stable and satisfactory condition. There were no complications.

## 2020-02-03 NOTE — Anesthesia Procedure Notes (Signed)
Spinal  Patient location during procedure: OR Start time: 02/03/2020 12:31 PM End time: 02/03/2020 12:36 PM Staffing Performed: anesthesiologist  Anesthesiologist: Lynda Rainwater, MD Preanesthetic Checklist Completed: patient identified, IV checked, site marked, risks and benefits discussed, surgical consent, monitors and equipment checked, pre-op evaluation and timeout performed Spinal Block Patient position: sitting Prep: DuraPrep Patient monitoring: heart rate, cardiac monitor, continuous pulse ox and blood pressure Approach: midline Location: L3-4 Injection technique: single-shot Needle Needle type: Quincke  Needle gauge: 22 G Needle length: 9 cm Assessment Sensory level: T4

## 2020-02-03 NOTE — Anesthesia Postprocedure Evaluation (Signed)
Anesthesia Post Note  Patient: Kyle Wise  Procedure(s) Performed: UNICOMPARTMENTAL KNEE (Left Knee)     Patient location during evaluation: PACU Anesthesia Type: Spinal Level of consciousness: awake and alert Pain management: pain level controlled Vital Signs Assessment: post-procedure vital signs reviewed and stable Respiratory status: spontaneous breathing, nonlabored ventilation and respiratory function stable Cardiovascular status: blood pressure returned to baseline and stable Postop Assessment: no apparent nausea or vomiting Anesthetic complications: no   No complications documented.  Last Vitals:  Vitals:   02/03/20 1530 02/03/20 1545  BP: 114/71 126/75  Pulse: (!) 57 (!) 59  Resp: 13 14  Temp:    SpO2: 97% 98%    Last Pain:  Vitals:   02/03/20 1615  TempSrc:   PainSc: 0-No pain                 Lynda Rainwater

## 2020-02-03 NOTE — H&P (Signed)
PREOPERATIVE H&P  Chief Complaint: Left knee pain  HPI: Kyle Wise is a 79 y.o. male who presents for preoperative history and physical with a diagnosis of left knee primary localized osteoarthritis. Symptoms are rated as moderate to severe, and have been worsening.  This is significantly impairing activities of daily living.  He has elected for surgical management.   He has failed injections, activity modification, anti-inflammatories, and assistive devices.  Preoperative X-rays demonstrate end stage degenerative changes with osteophyte formation, loss of joint space, subchondral sclerosis.   Past Medical History:  Diagnosis Date  . Allergy    seasonal  . Anemia   . Anxiety   . Arthritis   . Asthma    no meds now  . Frequent UTI   . History of colon polyps   . Hyperlipidemia   . Hypothyroidism   . Left knee DJD   . Legal blindness   . Low grade B-cell lymphoma ([place]) 2005   low grade b-cell lymphoma  . Osteoporosis    Past Surgical History:  Procedure Laterality Date  . APPENDECTOMY  1999  . BRAIN SURGERY  12/2008   meningioma   . COLON SURGERY  1999   colon resection  . EYE SURGERY     cataract  . KNEE ARTHROSCOPY Left 05/2009  . PROSTATE ABLATION  2009   "microwave procedure", Dr Hazle Nordmann  . TONSILLECTOMY AND ADENOIDECTOMY  1945   Social History   Socioeconomic History  . Marital status: Married    Spouse name: Not on file  . Number of children: 2  . Years of education: Not on file  . Highest education level: Not on file  Occupational History  . Occupation: retiredProgramme researcher, broadcasting/film/video for [employer]  Tobacco Use  . Smoking status: Never Smoker  . Smokeless tobacco: Never Used  Substance and Sexual Activity  . Alcohol use: Yes    Comment: socially  . Drug use: Never  . Sexual activity: Not on file  Other Topics Concern  . Not on file  Social History Narrative   Household: Pt and wife   Social Determinants of Health   Financial Resource Strain:   . Difficulty  of Paying Living Expenses: Not on file  Food Insecurity:   . Worried About Charity fundraiser in the Last Year: Not on file  . Ran Out of Food in the Last Year: Not on file  Transportation Needs:   . Lack of Transportation (Medical): Not on file  . Lack of Transportation (Non-Medical): Not on file  Physical Activity:   . Days of Exercise per Week: Not on file  . Minutes of Exercise per Session: Not on file  Stress:   . Feeling of Stress : Not on file  Social Connections:   . Frequency of Communication with Friends and Family: Not on file  . Frequency of Social Gatherings with Friends and Family: Not on file  . Attends Religious Services: Not on file  . Active Member of Clubs or Organizations: Not on file  . Attends Archivist Meetings: Not on file  . Marital Status: Not on file   Family History  Problem Relation Age of Onset  . CAD Mother        MI age 52  . Arthritis Mother   . Heart attack Mother   . Cancer Mother   . Colon cancer Father   . Arthritis Father   . Diabetes Maternal Grandmother   . CAD Maternal Grandfather   .  Heart attack Maternal Grandfather   . Asthma Brother   . Arthritis Brother   . Cancer Paternal Grandmother   . Prostate cancer Neg Hx    No Known Allergies Prior to Admission medications   Medication Sig Start Date End Date Taking? Authorizing Provider  Ascorbic Acid (VITAMIN C) 1000 MG tablet Take 1,000 mg by mouth daily.   Yes [provider]  levothyroxine (SYNTHROID) 50 MCG tablet Take 1 tablet (50 mcg total) by mouth daily. 01/13/20  Yes Shamleffer, Melanie Crazier, MD  melatonin 5 MG TABS Take 5 mg by mouth at bedtime.   Yes [provider]  Multiple Vitamin (MULTIVITAMIN WITH MINERALS) TABS tablet Take 1 tablet by mouth daily.   Yes [provider]  NALTREXONE HCL PO Take 4.5 mg by mouth daily.    Yes [provider]  tamsulosin (FLOMAX) 0.4 MG CAPS capsule Take 0.4 mg by mouth 2 (two) times daily.  11/15/17  Yes [provider]     Positive ROS: All other systems have been reviewed and were otherwise negative with the exception of those mentioned in the HPI and as above.  Physical Exam: General: Alert, no acute distress Cardiovascular: No pedal edema Respiratory: No cyanosis, no use of accessory musculature GI: No organomegaly, abdomen is soft and non-tender Skin: No lesions in the area of chief complaint Neurologic: Sensation intact distally Psychiatric: Patient is competent for consent with normal mood and affect Lymphatic: No axillary or cervical lymphadenopathy  MUSCULOSKELETAL: Left knee has crepitance with pseudolaxity, 0 to 120 degrees of motion, positive pain along the medial joint line.  Assessment: Left anteromedial knee osteoarthritis   Plan: Plan for Procedure(s): LEFT UNICOMPARTMENTAL KNEE  The risks benefits and alternatives were discussed with the patient including but not limited to the risks of nonoperative treatment, versus surgical intervention including infection, bleeding, nerve injury,  blood clots, cardiopulmonary complications, morbidity, mortality, among others, and they were willing to proceed.    Patient's anticipated LOS is less than 2 midnights, meeting these requirements: - Younger than 50 - Lives within 1 hour of care - Has a competent adult at home to recover with post-op recover - NO history of  - Chronic pain requiring opiods  - Diabetes  - Coronary Artery Disease  - Heart failure  - Heart attack  - Stroke  - DVT/VTE  - Cardiac arrhythmia  - Respiratory Failure/COPD  - Renal failure  - Anemia  - Advanced Liver disease        Kyle Bridge, MD Cell [phone]   02/03/2020 12:10 PM

## 2020-02-03 NOTE — Transfer of Care (Signed)
Immediate Anesthesia Transfer of Care Note  Patient: Kyle Wise  Procedure(s) Performed: UNICOMPARTMENTAL KNEE (Left Knee)  Patient Location: PACU  Anesthesia Type:Spinal  Level of Consciousness: drowsy, patient cooperative and responds to stimulation  Airway & Oxygen Therapy: Patient Spontanous Breathing and Patient connected to face mask oxygen  Post-op Assessment: Report given to RN and Post -op Vital signs reviewed and stable  Post vital signs: Reviewed and stable  Last Vitals:  Vitals Value Taken Time  BP 107/65 02/03/20 1457  Temp    Pulse 65 02/03/20 1459  Resp 14 02/03/20 1459  SpO2 100 % 02/03/20 1459  Vitals shown include unvalidated device data.  Last Pain:  Vitals:   02/03/20 1126  TempSrc: Oral  PainSc: 0-No pain      Patients Stated Pain Goal: 4 (89/37/34 2876)  Complications: No complications documented.

## 2020-02-03 NOTE — Progress Notes (Signed)
Following bladder scan results of approximately 600 ml urine in bladder, two attempts to pass a catheter were made. First attempt with standard in and out 25f catheter, resistance met at prostate, unable to pass.  Second attempt with 169fcatheter, resistance met at prostate, unable to pass.  Patient tolerated well. Reports not feeling pain.

## 2020-02-03 NOTE — Anesthesia Preprocedure Evaluation (Addendum)
Anesthesia Evaluation  Patient identified by MRN, date of birth, ID band Patient awake    Reviewed: Allergy & Precautions, NPO status , Patient's Chart, lab work & pertinent test results  Airway Mallampati: II  TM Distance: >3 FB Neck ROM: Full    Dental no notable dental hx.    Pulmonary asthma ,    Pulmonary exam normal breath sounds clear to auscultation       Cardiovascular negative cardio ROS Normal cardiovascular exam Rhythm:Regular Rate:Normal     Neuro/Psych Anxiety negative neurological ROS  negative psych ROS   GI/Hepatic negative GI ROS, Neg liver ROS,   Endo/Other  Hypothyroidism   Renal/GU negative Renal ROS  negative genitourinary   Musculoskeletal  (+) Arthritis , Osteoarthritis,    Abdominal   Peds negative pediatric ROS (+)  Hematology negative hematology ROS (+)   Anesthesia Other Findings   Reproductive/Obstetrics negative OB ROS                             Anesthesia Physical Anesthesia Plan  ASA: II  Anesthesia Plan: Spinal   Post-op Pain Management:  Regional for Post-op pain   Induction: Intravenous  PONV Risk Score and Plan: 1 and Ondansetron and Treatment may vary due to age or medical condition  Airway Management Planned: Simple Face Mask  Additional Equipment:   Intra-op Plan:   Post-operative Plan:   Informed Consent: I have reviewed the patients History and Physical, chart, labs and discussed the procedure including the risks, benefits and alternatives for the proposed anesthesia with the patient or authorized representative who has indicated his/her understanding and acceptance.     Dental advisory given  Plan Discussed with: CRNA  Anesthesia Plan Comments:         Anesthesia Quick Evaluation

## 2020-02-03 NOTE — Progress Notes (Signed)
Assisted Dr. Miller with left, ultrasound guided, adductor canal block. Side rails up, monitors on throughout procedure. See vital signs in flow sheet. Tolerated Procedure well.  

## 2020-02-03 NOTE — Anesthesia Procedure Notes (Signed)
Date/Time: 02/03/2020 12:26 PM Performed by: Glory Buff, CRNA Oxygen Delivery Method: Simple face mask

## 2020-02-03 NOTE — Progress Notes (Signed)
Merlene Pulling PA called back after speaking with urologist about bladder scan of 650-641ml and pt unable to urinate- PACU nurses unable to in and out cath successfully.   Stated to assess for pain in the abdomen as spinal starts to wear off. D/C IV fluids and give scheduled Flomax.  If the patient complains of pain the nurse should call Dr. Tresa Moore who is on call for urology.

## 2020-02-03 NOTE — Evaluation (Signed)
Physical Therapy Evaluation Patient Details Name: Kyle Wise MRN: [id number] DOB: [birth year] Today's Date: 02/03/2020   History of Present Illness  Pt is an 79 y/o male admitted secondary to L unicompartmental knee replacement. PMH includes lymphoma.   Clinical Impression  Pt is s/p surgery above with deficits below. Seen following surgery at surgical day center. Pt tolerated mobility very well, only requiring min guard A for safety using RW during gait and stair navigation. Pt's wife present throughout and will be available to assist at d/c. Reviewed all necessary education and HEP with pt. Plan is to d/c home in the morning. No further acute PT needs at this time. Will sign off. If needs change, please re-consult.    Follow Up Recommendations Follow surgeon's recommendation for DC plan and follow-up therapies    Equipment Recommendations  None recommended by PT    Recommendations for Other Services       Precautions / Restrictions Precautions Precautions: Knee Precaution Booklet Issued: Yes (comment) Precaution Comments: Reviewed knee precautions and HEP handout with pt.  Restrictions Weight Bearing Restrictions: Yes LLE Weight Bearing: Weight bearing as tolerated      Mobility  Bed Mobility Overal bed mobility: Modified Independent             General bed mobility comments: Increased time, but no physical assist required.   Transfers Overall transfer level: Needs assistance Equipment used: Rolling walker (2 wheeled) Transfers: Sit to/from Stand Sit to Stand: Min guard         General transfer comment: Min guard for safety. Cues for safe hand placement.   Ambulation/Gait Ambulation/Gait assistance: Min guard Gait Distance (Feet): 125 Feet Assistive device: Rolling walker (2 wheeled) Gait Pattern/deviations: Step-to pattern;Step-through pattern;Decreased step length - right;Decreased step length - left;Decreased weight shift to left;Antalgic Gait velocity:  Decreased   General Gait Details: Able to progress to step through gait pattern with increased distance. Cues for gait mechanics and sequencing with RW.   Stairs Stairs: Yes Stairs assistance: Min guard Stair Management: With walker;Forwards;Step to pattern Number of Stairs: 2 General stair comments: Practiced going up single portable step X2 using RW. Cues for sequencing. PT demonstrated how to perform at home. Min guard for safety.   Wheelchair Mobility    Modified Rankin (Stroke Patients Only)       Balance Overall balance assessment: Needs assistance Sitting-balance support: No upper extremity supported;Feet supported Sitting balance-Leahy Scale: Good     Standing balance support: Bilateral upper extremity supported;During functional activity;No upper extremity supported Standing balance-Leahy Scale: Fair Standing balance comment: Can maintain static standing without UE support                              Pertinent Vitals/Pain Pain Assessment: 0-10 Pain Score: 3  Pain Location: L knee Pain Descriptors / Indicators: Aching;Operative site guarding Pain Intervention(s): Limited activity within patient's tolerance;Monitored during session;Repositioned    Home Living Family/patient expects to be discharged to:: Private residence Living Arrangements: Spouse/significant other Available Help at Discharge: Family Type of Home: House Home Access: Stairs to enter Entrance Stairs-Rails: Left Entrance Stairs-Number of Steps: 3 Home Layout: Two level;Able to live on main level with bedroom/bathroom Home Equipment: Gilford Rile - 2 wheels;Bedside commode      Prior Function Level of Independence: Independent               Hand Dominance        Extremity/Trunk Assessment  Upper Extremity Assessment Upper Extremity Assessment: Overall WFL for tasks assessed    Lower Extremity Assessment Lower Extremity Assessment: LLE deficits/detail LLE Deficits /  Details: Deficits consistent with post op pain and weakness.     Cervical / Trunk Assessment Cervical / Trunk Assessment: Normal  Communication   Communication: No difficulties  Cognition Arousal/Alertness: Awake/alert Behavior During Therapy: WFL for tasks assessed/performed Overall Cognitive Status: Within Functional Limits for tasks assessed                                        General Comments      Exercises Total Joint Exercises Ankle Circles/Pumps: AROM;Both;20 reps Quad Sets: AROM;Left;10 reps Short Arc Quad:  (verbally reviewed) Heel Slides: AROM;Left;10 reps Hip ABduction/ADduction:  (verbally reviewed) Straight Leg Raises:  (verbally reviewed) Long Arc Quad:  (verbally reviewed) Knee Flexion:  (verbally reviewed)   Assessment/Plan    PT Assessment Patent does not need any further PT services  PT Problem List         PT Treatment Interventions      PT Goals (Current goals can be found in the Care Plan section)  Acute Rehab PT Goals Patient Stated Goal: to go home in the morning PT Goal Formulation: With patient Time For Goal Achievement: 02/03/20 Potential to Achieve Goals: Good    Frequency     Barriers to discharge        Co-evaluation               AM-PAC PT "6 Clicks" Mobility  Outcome Measure Help needed turning from your back to your side while in a flat bed without using bedrails?: None Help needed moving from lying on your back to sitting on the side of a flat bed without using bedrails?: None Help needed moving to and from a bed to a chair (including a wheelchair)?: A Little Help needed standing up from a chair using your arms (e.g., wheelchair or bedside chair)?: A Little Help needed to walk in hospital room?: A Little Help needed climbing 3-5 steps with a railing? : A Little 6 Click Score: 20    End of Session Equipment Utilized During Treatment: Gait belt Activity Tolerance: Patient tolerated treatment  well Patient left: in bed;with call bell/phone within reach;with family/visitor present Nurse Communication: Mobility status PT Visit Diagnosis: Other abnormalities of gait and mobility (R26.89);Pain Pain - Right/Left: Left Pain - part of body: Knee    Time: 0712-1975 PT Time Calculation (min) (ACUTE ONLY): 32 min   Charges:   PT Evaluation $PT Eval Low Complexity: 1 Low PT Treatments $Gait Training: 8-22 mins        Lou Miner, DPT  Acute Rehabilitation Services  Pager: [phone] Office: [phone]   Rudean Hitt 02/03/2020, 6:30 PM

## 2020-02-04 DIAGNOSIS — F419 Anxiety disorder, unspecified: Secondary | ICD-10-CM | POA: Diagnosis not present

## 2020-02-04 DIAGNOSIS — M810 Age-related osteoporosis without current pathological fracture: Secondary | ICD-10-CM | POA: Diagnosis not present

## 2020-02-04 DIAGNOSIS — M1712 Unilateral primary osteoarthritis, left knee: Secondary | ICD-10-CM | POA: Diagnosis not present

## 2020-02-04 DIAGNOSIS — M25762 Osteophyte, left knee: Secondary | ICD-10-CM | POA: Diagnosis not present

## 2020-02-04 DIAGNOSIS — D649 Anemia, unspecified: Secondary | ICD-10-CM | POA: Diagnosis not present

## 2020-02-04 DIAGNOSIS — Z86010 Personal history of colonic polyps: Secondary | ICD-10-CM | POA: Diagnosis not present

## 2020-02-04 DIAGNOSIS — Z87440 Personal history of urinary (tract) infections: Secondary | ICD-10-CM | POA: Diagnosis not present

## 2020-02-04 DIAGNOSIS — E039 Hypothyroidism, unspecified: Secondary | ICD-10-CM | POA: Diagnosis not present

## 2020-02-04 DIAGNOSIS — E785 Hyperlipidemia, unspecified: Secondary | ICD-10-CM | POA: Diagnosis not present

## 2020-02-04 MED ORDER — ASPIRIN EC 325 MG PO TBEC: 325.0000 mg | DELAYED_RELEASE_TABLET | Freq: Two times a day (BID) | ORAL | 0 refills | Status: DC

## 2020-02-04 MED ORDER — ONDANSETRON HCL 4 MG PO TABS: 4.0000 mg | ORAL_TABLET | Freq: Three times a day (TID) | ORAL | 0 refills | Status: DC | PRN

## 2020-02-04 MED ORDER — HYDROCODONE-ACETAMINOPHEN 10-325 MG PO TABS
1.0000 | ORAL_TABLET | Freq: Four times a day (QID) | ORAL | 0 refills | Status: DC | PRN
Start: 2020-02-04 — End: 2020-03-25

## 2020-02-04 MED ORDER — SENNA-DOCUSATE SODIUM 8.6-50 MG PO TABS: 2.0000 | ORAL_TABLET | Freq: Every day | ORAL | 1 refills | Status: DC

## 2020-02-04 MED ORDER — BACLOFEN 10 MG PO TABS: 10.0000 mg | ORAL_TABLET | Freq: Three times a day (TID) | ORAL | 0 refills | Status: DC

## 2020-02-04 NOTE — Discharge Summary (Signed)
Discharge Summary  Patient ID: Kyle Wise MRN: [id number] DOB/AGE: [birth year] 79 y.o.  Admit date: 02/03/2020 Discharge date: 02/04/2020  Admission Diagnoses:  Osteoarthritis of left knee  Discharge Diagnoses:  Principal Problem:   Osteoarthritis of left knee Active Problems:   S/P left unicompartmental knee replacement   Past Medical History:  Diagnosis Date  . Allergy    seasonal  . Anemia   . Anxiety   . Arthritis   . Asthma    no meds now  . Frequent UTI   . History of colon polyps   . Hyperlipidemia   . Hypothyroidism   . Left knee DJD   . Legal blindness   . 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 Osteoporosis     Surgeries: Procedure(s): UNICOMPARTMENTAL KNEE on 02/03/2020   Consultants (if any):   Discharged Condition: Improved  Hospital Course: Kyle Wise is an 79 y.o. male who was admitted 02/03/2020 with a diagnosis of Osteoarthritis of left knee and went to the operating room on 02/03/2020 and underwent the above named procedures.    He was given perioperative antibiotics:  Anti-infectives (From admission, onward)   Start     Dose/Rate Route Frequency Ordered Stop   02/03/20 1615  ceFAZolin (ANCEF) IVPB 2g/100 mL premix        2 g 200 mL/hr over 30 Minutes Intravenous Every 6 hours 02/03/20 1609 02/04/20 0414   02/03/20 1115  ceFAZolin (ANCEF) IVPB 2g/100 mL premix        2 g 200 mL/hr over 30 Minutes Intravenous On call to O.R. 02/03/20 1111 02/03/20 1307    .  He was given sequential compression devices, early ambulation, and aspirin for DVT prophylaxis.  He benefited maximally from the hospital stay and there were no complications.    Recent vital signs:  Vitals:   02/04/20 0630 02/04/20 0715  BP:    Pulse: 73 72  Resp:    Temp:    SpO2: 95% 95%    Recent laboratory studies:  Lab Results  Component Value Date   HGB 16.2 01/30/2020   HGB 15.7 12/24/2019   HGB 16.6 12/27/2018   Lab Results  Component  Value Date   WBC 5.7 01/30/2020   PLT 159 01/30/2020   No results found for: INR Lab Results  Component Value Date   NA 142 12/24/2019   K 4.0 12/24/2019   CL 106 12/24/2019   CO2 31 12/24/2019   BUN 15 12/24/2019   CREATININE 0.95 12/24/2019   GLUCOSE 104 (H) 12/24/2019    Discharge Medications:   Allergies as of 02/04/2020   No Known Allergies     Medication List    STOP taking these medications   NALTREXONE HCL PO     TAKE these medications   aspirin EC 325 MG tablet Take 1 tablet (325 mg total) by mouth 2 (two) times daily.   baclofen 10 MG tablet Commonly known as: LIORESAL Take 1 tablet (10 mg total) by mouth 3 (three) times daily. As needed for muscle spasm   HYDROcodone-acetaminophen 10-325 MG tablet Commonly known as: Norco Take 1 tablet by mouth every 6 (six) hours as needed.   levothyroxine 50 MCG tablet Commonly known as: SYNTHROID Take 1 tablet (50 mcg total) by mouth daily.   melatonin 5 MG Tabs Take 5 mg by mouth at bedtime.   multivitamin with minerals Tabs tablet Take 1 tablet by mouth daily.   ondansetron  4 MG tablet Commonly known as: Zofran Take 1 tablet (4 mg total) by mouth every 8 (eight) hours as needed for nausea or vomiting.   sennosides-docusate sodium 8.6-50 MG tablet Commonly known as: SENOKOT-S Take 2 tablets by mouth daily.   tamsulosin 0.4 MG Caps capsule Commonly known as: FLOMAX Take 0.4 mg by mouth 2 (two) times daily.   vitamin C 1000 MG tablet Take 1,000 mg by mouth daily.       Diagnostic Studies: DG Knee Left Port  Result Date: 02/03/2020 CLINICAL DATA:  Status post unicompartmental knee replacement. EXAM: PORTABLE LEFT KNEE - 1-2 VIEW COMPARISON:  None. FINDINGS: Medial compartment arthroplasty in expected alignment. No periprosthetic lucency or fracture. Recent postsurgical change includes air and edema in the joint space and soft tissues. IMPRESSION: Medial compartment arthroplasty without immediate  postoperative complication. Electronically Signed   By: Keith Rake M.D.   On: 02/03/2020 21:24    Disposition: Discharge disposition: 01-Home or Self Care          Follow-up Information    Marchia Bond, MD. Go on 02/16/2020.   Specialty: Orthopedic Surgery Why: Your appointment is scheduled for 9:00.  Contact information: [place] [phone]        [facility]. Go on 02/05/2020.   Why: Your appointment is scheduled for 9:30 Please arrive a few minutes early to complete your paperwork  Contact information: [phone]               Signed: Ventura Bruns PA-C 02/04/2020, 8:17 AM

## 2020-02-04 NOTE — Discharge Instructions (Signed)
INSTRUCTIONS AFTER JOINT REPLACEMENT   o Remove items at home which could result in a fall. This includes throw rugs or furniture in walking pathways o ICE to the affected joint every three hours while awake for 30 minutes at a time, for at least the first 3-5 days, and then as needed for pain and swelling.  Continue to use ice for pain and swelling. You may notice swelling that will progress down to the foot and ankle.  This is normal after surgery.  Elevate your leg when you are not up walking on it.   o Continue to use the breathing machine you got in the hospital (incentive spirometer) which will help keep your temperature down.  It is common for your temperature to cycle up and down following surgery, especially at night when you are not up moving around and exerting yourself.  The breathing machine keeps your lungs expanded and your temperature down.   DIET:  As you were doing prior to hospitalization, we recommend a well-balanced diet.  DRESSING / WOUND CARE / SHOWERING  You may change your dressing 3-5 days after surgery.  Then change the dressing every day with sterile gauze.  Please use good hand washing techniques before changing the dressing.  Do not use any lotions or creams on the incision until instructed by your surgeon.  ACTIVITY  o Increase activity slowly as tolerated, but follow the weight bearing instructions below.   o No driving for 6 weeks or until further direction given by your physician.  You cannot drive while taking narcotics.  o No lifting or carrying greater than 10 lbs. until further directed by your surgeon. o Avoid periods of inactivity such as sitting longer than an hour when not asleep. This helps prevent blood clots.  o You may return to work once you are authorized by your doctor.     WEIGHT BEARING   Weight bearing as tolerated with assist device (walker, cane, etc) as directed, use it as long as suggested by your surgeon or therapist, typically at  least 4-6 weeks.   EXERCISES  Results after joint replacement surgery are often greatly improved when you follow the exercise, range of motion and muscle strengthening exercises prescribed by your doctor. Safety measures are also important to protect the joint from further injury. Any time any of these exercises cause you to have increased pain or swelling, decrease what you are doing until you are comfortable again and then slowly increase them. If you have problems or questions, call your caregiver or physical therapist for advice.   Rehabilitation is important following a joint replacement. After just a few days of immobilization, the muscles of the leg can become weakened and shrink (atrophy).  These exercises are designed to build up the tone and strength of the thigh and leg muscles and to improve motion. Often times heat used for twenty to thirty minutes before working out will loosen up your tissues and help with improving the range of motion but do not use heat for the first two weeks following surgery (sometimes heat can increase post-operative swelling).   These exercises can be done on a training (exercise) mat, on the floor, on a table or on a bed. Use whatever works the best and is most comfortable for you.    Use music or television while you are exercising so that the exercises are a pleasant break in your day. This will make your life better with the exercises acting as a break   in your routine that you can look forward to.   Perform all exercises about fifteen times, three times per day or as directed.  You should exercise both the operative leg and the other leg as well.  Exercises include:   . Quad Sets - Tighten up the muscle on the front of the thigh (Quad) and hold for 5-10 seconds.   . Straight Leg Raises - With your knee straight (if you were given a brace, keep it on), lift the leg to 60 degrees, hold for 3 seconds, and slowly lower the leg.  Perform this exercise against  resistance later as your leg gets stronger.  . Leg Slides: Lying on your back, slowly slide your foot toward your buttocks, bending your knee up off the floor (only go as far as is comfortable). Then slowly slide your foot back down until your leg is flat on the floor again.  . Angel Wings: Lying on your back spread your legs to the side as far apart as you can without causing discomfort.  . Hamstring Strength:  Lying on your back, push your heel against the floor with your leg straight by tightening up the muscles of your buttocks.  Repeat, but this time bend your knee to a comfortable angle, and push your heel against the floor.  You may put a pillow under the heel to make it more comfortable if necessary.   A rehabilitation program following joint replacement surgery can speed recovery and prevent re-injury in the future due to weakened muscles. Contact your doctor or a physical therapist for more information on knee rehabilitation.    CONSTIPATION  Constipation is defined medically as fewer than three stools per week and severe constipation as less than one stool per week.  Even if you have a regular bowel pattern at home, your normal regimen is likely to be disrupted due to multiple reasons following surgery.  Combination of anesthesia, postoperative narcotics, change in appetite and fluid intake all can affect your bowels.   YOU MUST use at least one of the following options; they are listed in order of increasing strength to get the job done.  They are all available over the counter, and you may need to use some, POSSIBLY even all of these options:    Drink plenty of fluids (prune juice may be helpful) and high fiber foods Colace 100 mg by mouth twice a day  Senokot for constipation as directed and as needed Dulcolax (bisacodyl), take with full glass of water  Miralax (polyethylene glycol) once or twice a day as needed.  If you have tried all these things and are unable to have a bowel  movement in the first 3-4 days after surgery call either your surgeon or your primary doctor.    If you experience loose stools or diarrhea, hold the medications until you stool forms back up.  If your symptoms do not get better within 1 week or if they get worse, check with your doctor.  If you experience "the worst abdominal pain ever" or develop nausea or vomiting, please contact the office immediately for further recommendations for treatment.   ITCHING:  If you experience itching with your medications, try taking only a single pain pill, or even half a pain pill at a time.  You can also use Benadryl over the counter for itching or also to help with sleep.   TED HOSE STOCKINGS:  Use stockings on both legs until for at least 2 weeks or as   directed by physician office. They may be removed at night for sleeping.  MEDICATIONS:  See your medication summary on the "After Visit Summary" that nursing will review with you.  You may have some home medications which will be placed on hold until you complete the course of blood thinner medication.  It is important for you to complete the blood thinner medication as prescribed.  PRECAUTIONS:  If you experience chest pain or shortness of breath - call 911 immediately for transfer to the hospital emergency department.   If you develop a fever greater that 101 F, purulent drainage from wound, increased redness or drainage from wound, foul odor from the wound/dressing, or calf pain - CONTACT YOUR SURGEON.                                                   FOLLOW-UP APPOINTMENTS:  If you do not already have a post-op appointment, please call the office for an appointment to be seen by your surgeon.  Guidelines for how soon to be seen are listed in your "After Visit Summary", but are typically between 1-4 weeks after surgery.  OTHER INSTRUCTIONS:   Knee Replacement:  Do not place pillow under knee, focus on keeping the knee straight while resting. CPM  instructions: 0-90 degrees, 2 hours in the morning, 2 hours in the afternoon, and 2 hours in the evening. Place foam block, curve side up under heel at all times except when in CPM or when walking.  DO NOT modify, tear, cut, or change the foam block in any way.   DENTAL ANTIBIOTICS:  In most cases prophylactic antibiotics for Dental procdeures after total joint surgery are not necessary.  Exceptions are as follows:  1. History of prior total joint infection  2. Severely immunocompromised (Organ Transplant, cancer chemotherapy, Rheumatoid biologic meds such as [place])  3. Poorly controlled diabetes (A1C &gt; 8.0, blood glucose over 200)  If you have one of these conditions, contact your surgeon for an antibiotic prescription, prior to your dental procedure.   MAKE SURE YOU:  . Understand these instructions.  . Get help right away if you are not doing well or get worse.    Thank you for letting us be a part of your medical care team.  It is a privilege we respect greatly.  We hope these instructions will help you stay on track for a fast and full recovery!   Regional Anesthesia Blocks  1. Numbness or the inability to move the "blocked" extremity may last from 3-48 hours after placement. The length of time depends on the medication injected and your individual response to the medication. If the numbness is not going away after 48 hours, call your surgeon.  2. The extremity that is blocked will need to be protected until the numbness is gone and the  Strength has returned. Because you cannot feel it, you will need to take extra care to avoid injury. Because it may be weak, you may have difficulty moving it or using it. You may not know what position it is in without looking at it while the block is in effect.  3. For blocks in the legs and feet, returning to weight bearing and walking needs to be done carefully. You will need to wait until the numbness is entirely gone and the strength  has returned.  You should be able to move your leg and foot normally before you try and bear weight or walk. You will need someone to be with you when you first try to ensure you do not fall and possibly risk injury.  4. Bruising and tenderness at the needle site are common side effects and will resolve in a few days.  5. Persistent numbness or new problems with movement should be communicated to the surgeon or the [facility] [phone] [place] [phone]).

## 2020-02-05 ENCOUNTER — Other Ambulatory Visit: Payer: Self-pay

## 2020-02-05 ENCOUNTER — Encounter: Payer: Self-pay | Admitting: Physical Therapy

## 2020-02-05 ENCOUNTER — Ambulatory Visit: Payer: Medicare HMO | Attending: Orthopedic Surgery | Admitting: Physical Therapy

## 2020-02-05 DIAGNOSIS — M25562 Pain in left knee: Secondary | ICD-10-CM | POA: Diagnosis not present

## 2020-02-05 DIAGNOSIS — M25662 Stiffness of left knee, not elsewhere classified: Secondary | ICD-10-CM | POA: Insufficient documentation

## 2020-02-05 DIAGNOSIS — R600 Localized edema: Secondary | ICD-10-CM | POA: Insufficient documentation

## 2020-02-05 DIAGNOSIS — R262 Difficulty in walking, not elsewhere classified: Secondary | ICD-10-CM | POA: Insufficient documentation

## 2020-02-05 NOTE — Patient Instructions (Signed)
Access Code: [email or web address] URL: [email or web address] Date: 02/05/2020 Prepared by: Amador Cunas  Exercises Seated Knee Flexion Stretch - 1 x daily - 7 x weekly - 3 sets - 5 reps - 10-20 sec hold Seated Knee Extension Stretch with Chair - 1 x daily - 7 x weekly - 3 sets - 10 reps - 3 sec hold

## 2020-02-05 NOTE — Therapy (Signed)
[address] [place], [place], [address] Phone: [phone]   Fax:  [phone]  Physical Therapy Evaluation  Patient Details  Name: Kyle Wise MRN: [id number] Date of Birth: 09/10/1940 Referring Provider (PT): Larose Kells   Encounter Date: 02/05/2020   PT End of Session - 02/05/20 1022    Visit Number 1    Date for PT Re-Evaluation 04/06/20    PT Start Time 0942    PT Stop Time 1025    PT Time Calculation (min) 43 min    Activity Tolerance Patient tolerated treatment well    Behavior During Therapy [facility] for tasks assessed/performed           Past Medical History:  Diagnosis Date  . Allergy    seasonal  . Anemia   . Anxiety   . Arthritis   . Asthma    no meds now  . Frequent UTI   . History of colon polyps   . Hyperlipidemia   . Hypothyroidism   . Left knee DJD   . Legal blindness   . Low grade B-cell lymphoma ([place]) 2005   low grade b-cell lymphoma  . Osteoporosis     Past Surgical History:  Procedure Laterality Date  . APPENDECTOMY  1999  . BRAIN SURGERY  12/2008   meningioma   . COLON SURGERY  1999   colon resection  . EYE SURGERY     cataract  . KNEE ARTHROSCOPY Left 05/2009  . PROSTATE ABLATION  2009   "microwave procedure", Dr Hazle Nordmann  . TONSILLECTOMY AND ADENOIDECTOMY  1945    There were no vitals filed for this visit.    Subjective Assessment - 02/05/20 0944    Subjective Pt reports to clinic after L TKA on 02/03/2020. Pt ambulates into clinic with RW and states that he is feeling pretty good.    Pertinent History legally blind, arthritis    Limitations Standing;Walking    Patient Stated Goals regain knee motion, reduce pain    Currently in Pain? No/denies    Pain Score 0-No pain    Pain Location Knee    Pain Orientation Left    Pain Type Surgical pain              [facility] PT Assessment - 02/05/20 0001      Assessment   Medical Diagnosis L TKA    Referring Provider  (PT) Paz    Onset Date/Surgical Date 02/03/20    Next MD Visit 02/16/2020    Prior Therapy PT for TKA      Precautions   Precautions None      Restrictions   Weight Bearing Restrictions No      Balance Screen   Has the patient fallen in the past 6 months No    Has the patient had a decrease in activity level because of a fear of falling?  No    Is the patient reluctant to leave their home because of a fear of falling?  No      Home Environment   Additional Comments stairs at home; spiral staircase, 3 stairs to enter home HR on R side      Prior Function   Level of Independence Independent    Vocation Retired    [employer], walkng      Observation/Other Assessments-Edema    Edema Circumferential      Circumferential Edema   Circumferential - Right 34 cm mid patella  Circumferential - Left  42 cm mid patella      Sensation   Light Touch Appears Intact   some deficits with light touch on lateral knee     ROM / Strength   AROM / PROM / Strength AROM;PROM      AROM   AROM Assessment Site Knee    Right/Left Knee Left    Left Knee Extension 15    Left Knee Flexion 90      PROM   PROM Assessment Site Knee    Right/Left Knee Left    Left Knee Extension 8    Left Knee Flexion 95      Standardized Balance Assessment   Standardized Balance Assessment Timed Up and Go Test      Timed Up and Go Test   Normal TUG (seconds) 17.29    TUG Comments with RW, knee flexed and adducted with gait                      Objective measurements completed on examination: See above findings.       [facility] Adult PT Treatment/Exercise - 02/05/20 0001      Exercises   Exercises Knee/Hip      Knee/Hip Exercises: Stretches   Knee: Self-Stretch to increase Flexion Left;2 reps;20 seconds    Other Knee/Hip Stretches knee ext stretch seated with quad contraction x10 3 sec hold      Modalities   Modalities Vasopneumatic      Vasopneumatic   Number Minutes Vasopneumatic   10 minutes    Vasopnuematic Location  Knee    Vasopneumatic Pressure Low    Vasopneumatic Temperature  34                  PT Education - 02/05/20 1021    Education Details Pt educated on POC and HEP    Person(s) Educated Patient;Spouse    Methods Explanation;Demonstration;Handout    Comprehension Verbalized understanding;Returned demonstration            PT Short Term Goals - 02/05/20 1036      PT SHORT TERM GOAL #1   Title Pt will be independent with HEP    Time 2    Period Weeks    Status New    Target Date 02/19/20             PT Long Term Goals - 02/05/20 1036      PT LONG TERM GOAL #1   Title Pt will demo L knee flexion 110 deg    Baseline 90    Time 8    Period Weeks    Status New    Target Date 04/01/20      PT LONG TERM GOAL #2   Title Pt will demo full knee extension    Baseline -8    Time 8    Period Weeks    Status New    Target Date 04/01/20      PT LONG TERM GOAL #3   Title Pt will demo TUG <15 sec with proper form/alignment and no AD    Baseline 17 sec with RW and knee flexed/adducted    Time 8    Period Weeks    Status New    Target Date 04/01/20                  Plan - 02/05/20 1023    Clinical Impression Statement Pt present to clinic s/p L TKA 02/03/2020.  Pt able to obtain 90 deg knee flexion and -10 knee extension in clinic today. With TUG, pt demos knee significantly flexed and adducted in stance phase with antalgic gait. Pt educated on HEP for knee flexion/extension. Responded well to vaso. Pt would benefit from skilled PT to address the above impairments.    Personal Factors and Comorbidities Comorbidity 2    Comorbidities arthritis, legally blind    Examination-Activity Limitations Stand;Stairs;Locomotion Level    Examination-Participation Restrictions Community Activity;Interpersonal Relationship    Stability/Clinical Decision Making Evolving/Moderate complexity    Clinical Decision Making Low    Rehab Potential  Good    PT Frequency 2x / week    PT Duration 8 weeks    PT Treatment/Interventions ADLs/Self Care Home Management;Electrical Stimulation;Neuromuscular re-education;Balance training;Therapeutic exercise;Therapeutic activities;Functional mobility training;Stair training;Gait training;Patient/family education;Manual techniques;Passive range of motion;Vasopneumatic Device    PT Next Visit Plan push rom and function    PT Home Exercise Plan knee flexion/extension stretch    Consulted and Agree with Plan of Care Patient           Patient will benefit from skilled therapeutic intervention in order to improve the following deficits and impairments:  Abnormal gait, Decreased range of motion, Difficulty walking, Decreased endurance, Decreased activity tolerance, Pain, Hypomobility, Increased edema, Postural dysfunction  Visit Diagnosis: Acute pain of left knee  Stiffness of left knee, not elsewhere classified  Difficulty in walking, not elsewhere classified  Localized edema     Problem List Patient Active Problem List   Diagnosis Date Noted  . S/P left unicompartmental knee replacement 02/03/2020  . Acquired hypothyroidism 01/14/2020  . Osteoarthritis of left knee 12/30/2019  . Hyperglycemia 08/19/2019  . Annual physical exam 12/20/2018  . PCP NOTES >>>>>>>>>> 06/18/2018  . Thyroid disease 12/15/2017  . DJD (degenerative joint disease) 12/15/2017  . BPH with elevated PSA 12/15/2017  . Low grade B-cell lymphoma ([place]) 09/03/2015  . Family history of colon cancer 08/25/2015  . Hypercholesterolemia 11/02/2012  . Asthma 11/02/2012   Amador Cunas, PT, DPT Donald Prose Lakenya Riendeau 02/05/2020, 10:42 AM  [address] [place], [place], [address] Phone: [phone]   Fax:  [phone]  Name: Kyle Wise MRN: [id number] Date of Birth: 06-May-1941

## 2020-02-09 ENCOUNTER — Other Ambulatory Visit: Payer: Self-pay

## 2020-02-09 ENCOUNTER — Ambulatory Visit: Payer: Medicare HMO | Admitting: Physical Therapy

## 2020-02-09 ENCOUNTER — Encounter: Payer: Self-pay | Admitting: Physical Therapy

## 2020-02-09 DIAGNOSIS — R262 Difficulty in walking, not elsewhere classified: Secondary | ICD-10-CM | POA: Diagnosis not present

## 2020-02-09 DIAGNOSIS — R600 Localized edema: Secondary | ICD-10-CM

## 2020-02-09 DIAGNOSIS — M25562 Pain in left knee: Secondary | ICD-10-CM | POA: Diagnosis not present

## 2020-02-09 DIAGNOSIS — M25662 Stiffness of left knee, not elsewhere classified: Secondary | ICD-10-CM | POA: Diagnosis not present

## 2020-02-09 NOTE — Therapy (Signed)
Kyle Wise, [place], [address] Phone: [phone]   Fax:  [phone]  Physical Therapy Treatment  Patient Details  Name: Kyle Wise MRN: [id number] Date of Birth: 1940/11/07 Referring Provider (PT): Larose Kells   Encounter Date: 02/09/2020   PT End of Session - 02/09/20 1135    Visit Number 2    Date for PT Re-Evaluation 04/06/20    PT Start Time 1059    PT Stop Time 1155    PT Time Calculation (min) 56 min    Activity Tolerance Patient tolerated treatment well    Behavior During Therapy [facility] for tasks assessed/performed           Past Medical History:  Diagnosis Date   Allergy    seasonal   Anemia    Anxiety    Arthritis    Asthma    no meds now   Frequent UTI    History of colon polyps    Hyperlipidemia    Hypothyroidism    Left knee DJD    Legal blindness    Low grade B-cell lymphoma ([place]) 2005   low grade b-cell lymphoma   Osteoporosis     Past Surgical History:  Procedure Laterality Date   [place]  12/2008   meningioma    COLON SURGERY  1999   colon resection   EYE SURGERY     cataract   KNEE ARTHROSCOPY Left 05/2009   PARTIAL KNEE ARTHROPLASTY Left 02/03/2020   Procedure: UNICOMPARTMENTAL KNEE;  Surgeon: Marchia Bond, MD;  Location: [facility];  Service: Orthopedics;  Laterality: Left;  combo anestesia of adductor canal block and spinal   PROSTATE ABLATION  2009   "microwave procedure", Dr Hazle Nordmann   TONSILLECTOMY AND ADENOIDECTOMY  1945    There were no vitals filed for this visit.   Subjective Assessment - 02/09/20 1101    Subjective Patient reports that he is moving slow, but thinks he could try walking with the cane    Currently in Pain? No/denies                             [facility] Adult PT Treatment/Exercise - 02/09/20 0001      Ambulation/Gait   Gait Comments worked on gait  with SPC      Knee/Hip Exercises: Aerobic   Recumbent Bike partial revs x 5 minutes    Nustep level 3 x 6 minutes      Knee/Hip Exercises: Machines for Strengthening   Cybex Knee Extension 5# 2x10 some stretch for flexion b/n sets    Cybex Knee Flexion 20# 2x10 some stretch for extension b./n sets      Knee/Hip Exercises: Supine   Short Arc Quad Sets Left;2 sets;10 reps    Short Arc Quad Sets Limitations tactile and verbal cues    Other Supine Knee/Hip Exercises feet on ball K2C, bridges      Modalities   Modalities Vasopneumatic      Vasopneumatic   Number Minutes Vasopneumatic  10 minutes    Vasopnuematic Location  Knee    Vasopneumatic Pressure Medium    Vasopneumatic Temperature  34                    PT Short Term Goals - 02/05/20 1036      PT SHORT TERM GOAL #1   Title Pt  will be independent with HEP    Time 2    Period Weeks    Status New    Target Date 02/19/20             PT Long Term Goals - 02/05/20 1036      PT LONG TERM GOAL #1   Title Pt will demo L knee flexion 110 deg    Baseline 90    Time 8    Period Weeks    Status New    Target Date 04/01/20      PT LONG TERM GOAL #2   Title Pt will demo full knee extension    Baseline -8    Time 8    Period Weeks    Status New    Target Date 04/01/20      PT LONG TERM GOAL #3   Title Pt will demo TUG <15 sec with proper form/alignment and no AD    Baseline 17 sec with RW and knee flexed/adducted    Time 8    Period Weeks    Status New    Target Date 04/01/20                 Plan - 02/09/20 1136    Clinical Impression Statement In the clinic and then after the session all walking was with a SPC, some cues and CGA, working on gait and step length.  Really tolerated the exercises without an increase of pain, he is tight going into flexion.  Cues for TKE needed.    PT Next Visit Plan push rom and function    Consulted and Agree with Plan of Care Patient           Patient  will benefit from skilled therapeutic intervention in order to improve the following deficits and impairments:  Abnormal gait, Decreased range of motion, Difficulty walking, Decreased endurance, Decreased activity tolerance, Pain, Hypomobility, Increased edema, Postural dysfunction  Visit Diagnosis: Acute pain of left knee  Stiffness of left knee, not elsewhere classified  Difficulty in walking, not elsewhere classified  Localized edema     Problem List Patient Active Problem List   Diagnosis Date Noted   S/P left unicompartmental knee replacement 02/03/2020   Acquired hypothyroidism 01/14/2020   Osteoarthritis of left knee 12/30/2019   Hyperglycemia 08/19/2019   Annual physical exam 12/20/2018   PCP NOTES >>>>>>>>>> 06/18/2018   Thyroid disease 12/15/2017   DJD (degenerative joint disease) 12/15/2017   BPH with elevated PSA 12/15/2017   Low grade B-cell lymphoma ([place]) 09/03/2015   Family history of colon cancer 08/25/2015   Hypercholesterolemia 11/02/2012   Asthma 11/02/2012    Kyle Boast., PT 02/09/2020, 11:38 AM  [address] [place], [place], [address] Phone: [phone]   Fax:  [phone]  Name: Kyle Wise MRN: [id number] Date of Birth: 12-09-1940

## 2020-02-11 ENCOUNTER — Other Ambulatory Visit: Payer: Self-pay

## 2020-02-11 ENCOUNTER — Ambulatory Visit: Payer: Medicare HMO | Admitting: Physical Therapy

## 2020-02-11 ENCOUNTER — Encounter: Payer: Self-pay | Admitting: Physical Therapy

## 2020-02-11 DIAGNOSIS — R600 Localized edema: Secondary | ICD-10-CM

## 2020-02-11 DIAGNOSIS — R262 Difficulty in walking, not elsewhere classified: Secondary | ICD-10-CM | POA: Diagnosis not present

## 2020-02-11 DIAGNOSIS — M25562 Pain in left knee: Secondary | ICD-10-CM

## 2020-02-11 DIAGNOSIS — M25662 Stiffness of left knee, not elsewhere classified: Secondary | ICD-10-CM | POA: Diagnosis not present

## 2020-02-11 NOTE — Therapy (Signed)
[address] [place], [place], [address] Phone: [phone]   Fax:  [phone]  Physical Therapy Treatment  Patient Details  Name: Kyle Wise MRN: [id number] Date of Birth: 05/05/1941 Referring Provider (PT): Larose Kells   Encounter Date: 02/11/2020   PT End of Session - 02/11/20 1053    Visit Number 3    Date for PT Re-Evaluation 04/06/20    PT Start Time 1012    PT Stop Time 1107    PT Time Calculation (min) 55 min    Activity Tolerance Patient tolerated treatment well    Behavior During Therapy [facility] for tasks assessed/performed           Past Medical History:  Diagnosis Date  . Allergy    seasonal  . Anemia   . Anxiety   . Arthritis   . Asthma    no meds now  . Frequent UTI   . History of colon polyps   . Hyperlipidemia   . Hypothyroidism   . Left knee DJD   . Legal blindness   . Low grade B-cell lymphoma ([place]) 2005   low grade b-cell lymphoma  . Osteoporosis     Past Surgical History:  Procedure Laterality Date  . APPENDECTOMY  1999  . BRAIN SURGERY  12/2008   meningioma   . COLON SURGERY  1999   colon resection  . EYE SURGERY     cataract  . KNEE ARTHROSCOPY Left 05/2009  . PARTIAL KNEE ARTHROPLASTY Left 02/03/2020   Procedure: UNICOMPARTMENTAL KNEE;  Surgeon: Marchia Bond, MD;  Location: [facility];  Service: Orthopedics;  Laterality: Left;  combo anestesia of adductor canal block and spinal  . PROSTATE ABLATION  2009   "microwave procedure", Dr Hazle Nordmann  . TONSILLECTOMY AND ADENOIDECTOMY  1945    There were no vitals filed for this visit.   Subjective Assessment - 02/11/20 1017    Subjective A little sore but not to bad.    Currently in Pain? Yes    Pain Score 3     Pain Location Knee    Pain Orientation Left    Pain Descriptors / Indicators Sore    Aggravating Factors  movements              [facility] PT Assessment - 02/11/20 0001      AROM   Left  Knee Extension 18    Left Knee Flexion 93      PROM   Left Knee Extension 5    Left Knee Flexion 104                         [facility] Adult PT Treatment/Exercise - 02/11/20 0001      Knee/Hip Exercises: Stretches   Other Knee/Hip Stretches knee flexion stretches hold 30 s x 4       Knee/Hip Exercises: Aerobic   Recumbent Bike partial revs x 5 minutes    Nustep level 3 x 6 minutes      Knee/Hip Exercises: Machines for Strengthening   Cybex Knee Extension 5# 2x10 some stretch for flexion b/n sets    Cybex Knee Flexion 20# 2x10 some stretch for extension b./n sets    Cybex Leg Press 20# 2x10, left leg no weight, then both legs no weight working on ROM      Vasopneumatic   Number Minutes Vasopneumatic  10 minutes    Vasopnuematic Location  Knee    Vasopneumatic Pressure Medium    Vasopneumatic Temperature  34      Manual Therapy   Manual Therapy Passive ROM    Passive ROM flexiona nd extension                    PT Short Term Goals - 02/11/20 1054      PT SHORT TERM GOAL #1   Title Pt will be independent with HEP    Status Partially Met             PT Long Term Goals - 02/05/20 1036      PT LONG TERM GOAL #1   Title Pt will demo L knee flexion 110 deg    Baseline 90    Time 8    Period Weeks    Status New    Target Date 04/01/20      PT LONG TERM GOAL #2   Title Pt will demo full knee extension    Baseline -8    Time 8    Period Weeks    Status New    Target Date 04/01/20      PT LONG TERM GOAL #3   Title Pt will demo TUG <15 sec with proper form/alignment and no AD    Baseline 17 sec with RW and knee flexed/adducted    Time 8    Period Weeks    Status New    Target Date 04/01/20                 Plan - 02/11/20 1053    Clinical Impression Statement Patient is moving well with the [place].  I added some exercises and tried to make sure he is pushing the ROM at home, PROM is improved, he AROM is about the same.    PT Next  Visit Plan push rom and function    Consulted and Agree with Plan of Care Patient           Patient will benefit from skilled therapeutic intervention in order to improve the following deficits and impairments:  Abnormal gait, Decreased range of motion, Difficulty walking, Decreased endurance, Decreased activity tolerance, Pain, Hypomobility, Increased edema, Postural dysfunction  Visit Diagnosis: Acute pain of left knee  Stiffness of left knee, not elsewhere classified  Difficulty in walking, not elsewhere classified  Localized edema     Problem List Patient Active Problem List   Diagnosis Date Noted  . S/P left unicompartmental knee replacement 02/03/2020  . Acquired hypothyroidism 01/14/2020  . Osteoarthritis of left knee 12/30/2019  . Hyperglycemia 08/19/2019  . Annual physical exam 12/20/2018  . PCP NOTES >>>>>>>>>> 06/18/2018  . Thyroid disease 12/15/2017  . DJD (degenerative joint disease) 12/15/2017  . BPH with elevated PSA 12/15/2017  . Low grade B-cell lymphoma ([place]) 09/03/2015  . Family history of colon cancer 08/25/2015  . Hypercholesterolemia 11/02/2012  . Asthma 11/02/2012    Sumner Boast., PT 02/11/2020, 10:55 AM  [address] [place], [place], [address] Phone: [phone]   Fax:  [phone]  Name: [name] MRN: [id number] Date of Birth: [birth year]

## 2020-02-13 ENCOUNTER — Other Ambulatory Visit: Payer: Self-pay

## 2020-02-13 ENCOUNTER — Ambulatory Visit: Payer: Medicare HMO | Admitting: Physical Therapy

## 2020-02-13 ENCOUNTER — Encounter: Payer: Self-pay | Admitting: Physical Therapy

## 2020-02-13 DIAGNOSIS — M25662 Stiffness of left knee, not elsewhere classified: Secondary | ICD-10-CM | POA: Diagnosis not present

## 2020-02-13 DIAGNOSIS — R600 Localized edema: Secondary | ICD-10-CM | POA: Diagnosis not present

## 2020-02-13 DIAGNOSIS — R262 Difficulty in walking, not elsewhere classified: Secondary | ICD-10-CM

## 2020-02-13 DIAGNOSIS — M25562 Pain in left knee: Secondary | ICD-10-CM | POA: Diagnosis not present

## 2020-02-13 NOTE — Therapy (Signed)
[address] [place], [place], [address] Phone: [phone]   Fax:  [phone]  Physical Therapy Treatment  Patient Details  Name: Kyle Wise MRN: [id number] Date of Birth: 05/13/1941 Referring Provider (PT): Larose Kells   Encounter Date: 02/13/2020   PT End of Session - 02/13/20 0859    Visit Number 4    Date for PT Re-Evaluation 04/06/20    PT Start Time 0845    PT Stop Time 0940    PT Time Calculation (min) 55 min    Activity Tolerance Patient tolerated treatment well    Behavior During Therapy [facility] for tasks assessed/performed           Past Medical History:  Diagnosis Date  . Allergy    seasonal  . Anemia   . Anxiety   . Arthritis   . Asthma    no meds now  . Frequent UTI   . History of colon polyps   . Hyperlipidemia   . Hypothyroidism   . Left knee DJD   . Legal blindness   . Low grade B-cell lymphoma ([place]) 2005   low grade b-cell lymphoma  . Osteoporosis     Past Surgical History:  Procedure Laterality Date  . APPENDECTOMY  1999  . BRAIN SURGERY  12/2008   meningioma   . COLON SURGERY  1999   colon resection  . EYE SURGERY     cataract  . KNEE ARTHROSCOPY Left 05/2009  . PARTIAL KNEE ARTHROPLASTY Left 02/03/2020   Procedure: UNICOMPARTMENTAL KNEE;  Surgeon: Marchia Bond, MD;  Location: [place];  Service: Orthopedics;  Laterality: Left;  combo anestesia of adductor canal block and spinal  . PROSTATE ABLATION  2009   "microwave procedure", Dr Hazle Nordmann  . TONSILLECTOMY AND ADENOIDECTOMY  1945    There were no vitals filed for this visit.   Subjective Assessment - 02/13/20 0850    Subjective I think what we did last time helped    Currently in Pain? Yes    Pain Score 3     Pain Location Knee    Pain Orientation Left    Pain Relieving Factors ice                             [facility] Adult PT Treatment/Exercise - 02/13/20 0001      Ambulation/Gait    Gait Comments gait withiout device and with SPC, practiced stairs step over step, with step over step he tends to circumduct, needing cues, going down step over step he really avoids this even on the 4" steps      Knee/Hip Exercises: Aerobic   Recumbent Bike partial revs x 5 minutes    Nustep level 3 x 6 minutes      Knee/Hip Exercises: Machines for Strengthening   Cybex Knee Extension 5# 2x10 some stretch for flexion b/n sets    Cybex Knee Flexion 20# 2x10 some stretch for extension b./n sets    Cybex Leg Press 20# 2x10, left leg no weight, then both legs no weight working on ROM      Vasopneumatic   Number Minutes Vasopneumatic  10 minutes    Vasopnuematic Location  Knee    Vasopneumatic Pressure Medium    Vasopneumatic Temperature  34      Manual Therapy   Manual Therapy Passive ROM    Passive ROM flexiona nd extension  PT Short Term Goals - 02/11/20 1054      PT SHORT TERM GOAL #1   Title Pt will be independent with HEP    Status Partially Met             PT Long Term Goals - 02/13/20 0901      PT LONG TERM GOAL #1   Title Pt will demo L knee flexion 110 deg    Status On-going                 Plan - 02/13/20 0859    Clinical Impression Statement Patient did better with PROM, he is moving well, tried some gait without device and with SPC.    PT Next Visit Plan push rom and function    Consulted and Agree with Plan of Care Patient           Patient will benefit from skilled therapeutic intervention in order to improve the following deficits and impairments:  Abnormal gait, Decreased range of motion, Difficulty walking, Decreased endurance, Decreased activity tolerance, Pain, Hypomobility, Increased edema, Postural dysfunction  Visit Diagnosis: Acute pain of left knee  Stiffness of left knee, not elsewhere classified  Difficulty in walking, not elsewhere classified  Localized edema     Problem List Patient Active  Problem List   Diagnosis Date Noted  . S/P left unicompartmental knee replacement 02/03/2020  . Acquired hypothyroidism 01/14/2020  . Osteoarthritis of left knee 12/30/2019  . Hyperglycemia 08/19/2019  . Annual physical exam 12/20/2018  . PCP NOTES >>>>>>>>>> 06/18/2018  . Thyroid disease 12/15/2017  . DJD (degenerative joint disease) 12/15/2017  . BPH with elevated PSA 12/15/2017  . Low grade B-cell lymphoma ([place]) 09/03/2015  . Family history of colon cancer 08/25/2015  . Hypercholesterolemia 11/02/2012  . Asthma 11/02/2012    Sumner Boast., PT 02/13/2020, 9:20 AM  [address] [place], [place], [address] Phone: [phone]   Fax:  [phone]  Name: Kyle Wise MRN: [id number] Date of Birth: 06-08-1940

## 2020-02-16 ENCOUNTER — Other Ambulatory Visit: Payer: Self-pay

## 2020-02-16 ENCOUNTER — Encounter: Payer: Self-pay | Admitting: Physical Therapy

## 2020-02-16 ENCOUNTER — Ambulatory Visit: Payer: Medicare HMO | Admitting: Physical Therapy

## 2020-02-16 DIAGNOSIS — R600 Localized edema: Secondary | ICD-10-CM

## 2020-02-16 DIAGNOSIS — M25662 Stiffness of left knee, not elsewhere classified: Secondary | ICD-10-CM | POA: Diagnosis not present

## 2020-02-16 DIAGNOSIS — M25562 Pain in left knee: Secondary | ICD-10-CM | POA: Diagnosis not present

## 2020-02-16 DIAGNOSIS — R262 Difficulty in walking, not elsewhere classified: Secondary | ICD-10-CM

## 2020-02-16 DIAGNOSIS — M1712 Unilateral primary osteoarthritis, left knee: Secondary | ICD-10-CM | POA: Diagnosis not present

## 2020-02-16 NOTE — Therapy (Signed)
[place]. [place], [place], [address] Phone: [phone]   Fax:  [phone]  Physical Therapy Treatment  Patient Details  Name: Kyle Wise MRN: [id number] Date of Birth: [birth year] Referring Provider (PT): Larose Kells   Encounter Date: 02/16/2020   PT End of Session - 02/16/20 1446    Visit Number 5    Date for PT Re-Evaluation 04/06/20    PT Start Time 2778    PT Stop Time 1449    PT Time Calculation (min) 51 min    Activity Tolerance Patient tolerated treatment well    Behavior During Therapy [facility] for tasks assessed/performed           Past Medical History:  Diagnosis Date  . Allergy    seasonal  . Anemia   . Anxiety   . Arthritis   . Asthma    no meds now  . Frequent UTI   . History of colon polyps   . Hyperlipidemia   . Hypothyroidism   . Left knee DJD   . Legal blindness   . Low grade B-cell lymphoma ([place]) 2005   low grade b-cell lymphoma  . Osteoporosis     Past Surgical History:  Procedure Laterality Date  . APPENDECTOMY  1999  . BRAIN SURGERY  12/2008   meningioma   . COLON SURGERY  1999   colon resection  . EYE SURGERY     cataract  . KNEE ARTHROSCOPY Left 05/2009  . PARTIAL KNEE ARTHROPLASTY Left 02/03/2020   Procedure: UNICOMPARTMENTAL KNEE;  Surgeon: Marchia Bond, MD;  Location: [facility];  Service: Orthopedics;  Laterality: Left;  combo anestesia of adductor canal block and spinal  . PROSTATE ABLATION  2009   "microwave procedure", Dr Hazle Nordmann  . TONSILLECTOMY AND ADENOIDECTOMY  1945    There were no vitals filed for this visit.   Subjective Assessment - 02/16/20 1404    Subjective Pt reports doing well today    Currently in Pain? Yes    Pain Score 2     Pain Location Knee    Pain Orientation Left                             [facility] Adult PT Treatment/Exercise - 02/16/20 0001      Knee/Hip Exercises: Aerobic   Recumbent Bike partial  revs x 5 minutes    Nustep L5 x 6 min      Knee/Hip Exercises: Machines for Strengthening   Cybex Knee Extension 5# 2x10 some stretch for flexion b/n sets    Cybex Knee Flexion 20# 2x10 some stretch for extension b./n sets    Cybex Leg Press 30# 2x10 BLE, LLE no weight 2x10, BLE knee flexion stretch down to 3, calf raises 20# 2x15      Vasopneumatic   Number Minutes Vasopneumatic  15 minutes    Vasopnuematic Location  Knee    Vasopneumatic Pressure Medium    Vasopneumatic Temperature  34      Manual Therapy   Manual Therapy Passive ROM    Passive ROM flexiona nd extension                    PT Short Term Goals - 02/11/20 1054      PT SHORT TERM GOAL #1   Title Pt will be independent with HEP    Status Partially Met  PT Long Term Goals - 02/13/20 0901      PT LONG TERM GOAL #1   Title Pt will demo L knee flexion 110 deg    Status On-going                 Plan - 02/16/20 1448    Clinical Impression Statement Pt able to tolerate increased weight with leg press and knee extensions with no increase in knee pain. Cues for decreased lateral lean and increased knee flexion with gait w/out AD. Pt reports not using device outside of clinic.    PT Treatment/Interventions ADLs/Self Care Home Management;Electrical Stimulation;Neuromuscular re-education;Balance training;Therapeutic exercise;Therapeutic activities;Functional mobility training;Stair training;Gait training;Patient/family education;Manual techniques;Passive range of motion;Vasopneumatic Device    PT Next Visit Plan push rom and function           Patient will benefit from skilled therapeutic intervention in order to improve the following deficits and impairments:  Abnormal gait, Decreased range of motion, Difficulty walking, Decreased endurance, Decreased activity tolerance, Pain, Hypomobility, Increased edema, Postural dysfunction  Visit Diagnosis: Acute pain of left knee  Stiffness of  left knee, not elsewhere classified  Difficulty in walking, not elsewhere classified  Localized edema     Problem List Patient Active Problem List   Diagnosis Date Noted  . S/P left unicompartmental knee replacement 02/03/2020  . Acquired hypothyroidism 01/14/2020  . Osteoarthritis of left knee 12/30/2019  . Hyperglycemia 08/19/2019  . Annual physical exam 12/20/2018  . PCP NOTES >>>>>>>>>> 06/18/2018  . Thyroid disease 12/15/2017  . DJD (degenerative joint disease) 12/15/2017  . BPH with elevated PSA 12/15/2017  . Low grade B-cell lymphoma ([place]) 09/03/2015  . Family history of colon cancer 08/25/2015  . Hypercholesterolemia 11/02/2012  . Asthma 11/02/2012   Amador Cunas, PT, DPT Donald Prose [name] 02/16/2020, 2:49 PM  [place] [place]. [place], [place], [address] Phone: [phone]   Fax:  [phone]  Name: Kyle Wise MRN: [id number] Date of Birth: 09-May-1941

## 2020-02-18 ENCOUNTER — Other Ambulatory Visit: Payer: Self-pay

## 2020-02-18 ENCOUNTER — Ambulatory Visit: Payer: Medicare HMO | Admitting: Physical Therapy

## 2020-02-18 DIAGNOSIS — R262 Difficulty in walking, not elsewhere classified: Secondary | ICD-10-CM | POA: Diagnosis not present

## 2020-02-18 DIAGNOSIS — M25662 Stiffness of left knee, not elsewhere classified: Secondary | ICD-10-CM | POA: Diagnosis not present

## 2020-02-18 DIAGNOSIS — M25562 Pain in left knee: Secondary | ICD-10-CM | POA: Diagnosis not present

## 2020-02-18 DIAGNOSIS — R600 Localized edema: Secondary | ICD-10-CM

## 2020-02-18 NOTE — Therapy (Signed)
[place]. [place], [place], [address] Phone: [phone]   Fax:  [phone]  Physical Therapy Treatment  Patient Details  Name: Kyle Wise MRN: [id number] Date of Birth: 1940-09-25 Referring Provider (PT): Kyle Wise   Encounter Date: 02/18/2020   PT End of Session - 02/18/20 1538    Visit Number 6    Date for PT Re-Evaluation 04/06/20    PT Start Time 5462    PT Stop Time 1550    PT Time Calculation (min) 55 min           Past Medical History:  Diagnosis Date  . Allergy    seasonal  . Anemia   . Anxiety   . Arthritis   . Asthma    no meds now  . Frequent UTI   . History of colon polyps   . Hyperlipidemia   . Hypothyroidism   . Left knee DJD   . Legal blindness   . Low grade B-cell lymphoma ([place]) 2005   low grade b-cell lymphoma  . Osteoporosis     Past Surgical History:  Procedure Laterality Date  . APPENDECTOMY  1999  . BRAIN SURGERY  12/2008   meningioma   . COLON SURGERY  1999   colon resection  . EYE SURGERY     cataract  . KNEE ARTHROSCOPY Left 05/2009  . PARTIAL KNEE ARTHROPLASTY Left 02/03/2020   Procedure: UNICOMPARTMENTAL KNEE;  Surgeon: Marchia Bond, MD;  Location: [facility];  Service: Orthopedics;  Laterality: Left;  combo anestesia of adductor canal block and spinal  . PROSTATE ABLATION  2009   "microwave procedure", Dr Hazle Nordmann  . TONSILLECTOMY AND ADENOIDECTOMY  1945    There were no vitals filed for this visit.   Subjective Assessment - 02/18/20 1455    Subjective amb in without AD with a stiff LLE. " [facility] and [facility]"    Currently in Pain? Yes    Pain Score 2     Pain Location Knee    Pain Orientation Left              [facility] PT Assessment - 02/18/20 0001      AROM   Left Knee Extension 4    Left Knee Flexion 116                         [facility] Adult PT Treatment/Exercise - 02/18/20 0001       Ambulation/Gait   Gait Comments worked on step over step Pharmacist, hospital      Knee/Hip Exercises: Aerobic   Recumbent Bike 5 min full rev     Nustep L 5 6 min LE only      Knee/Hip Exercises: Machines for Strengthening   Cybex Leg Press 40# 2 sets 10   calf raises 2 set s10     Knee/Hip Exercises: Standing   Lateral Step Up Left;10 reps;Hand Hold: 1;Step Height: 6"    Forward Step Up Left;10 reps;Hand Hold: 2;Step Height: 6"    Step Down Left;10 reps;Hand Hold: 2;Step Height: 6"      Knee/Hip Exercises: Seated   Long Arc Quad Strengthening;Left;2 sets;10 reps;Weights    Long Arc Quad Weight 5 lbs.    Hamstring Curl Strengthening;Left;2 sets;10 reps   green tband   Sit to Sand 10 reps;without UE support   wt ball OH press  Vasopneumatic   Number Minutes Vasopneumatic  15 minutes    Vasopnuematic Location  Knee    Vasopneumatic Pressure Medium    Vasopneumatic Temperature  34      Manual Therapy   Manual Therapy Passive ROM    Passive ROM flex and ext                    PT Short Term Goals - 02/18/20 1539      PT SHORT TERM GOAL #1   Title Pt will be independent with HEP    [facility] [employer] PT per pt report    Status Achieved             PT Long Term Goals - 02/18/20 1539      PT LONG TERM GOAL #1   Title Pt will demo L knee flexion 110 deg    Status Achieved      PT LONG TERM GOAL #2   Title Pt will demo full knee extension    Status Partially Met      PT LONG TERM GOAL #3   Title Pt will demo TUG <15 sec with proper form/alignment and no AD    Status Achieved                 Plan - 02/18/20 1540    Clinical Impression Statement STG met. progressing with LTG. Kyle Wise is doing very well with all activities and ROM is very good. Pt does lack some TKE and needs cued with ex to acttivate quad and cued with gait for knee flexion in swing phase.    PT Treatment/Interventions ADLs/Self Care Home Management;Electrical  Stimulation;Neuromuscular re-education;Balance training;Therapeutic exercise;Therapeutic activities;Functional mobility training;Stair training;Gait training;Patient/family education;Manual techniques;Passive range of motion;Vasopneumatic Device    PT Next Visit Plan gait, TKE           Patient will benefit from skilled therapeutic intervention in order to improve the following deficits and impairments:  Abnormal gait, Decreased range of motion, Difficulty walking, Decreased endurance, Decreased activity tolerance, Pain, Hypomobility, Increased edema, Postural dysfunction  Visit Diagnosis: Acute pain of left knee  Stiffness of left knee, not elsewhere classified  Difficulty in walking, not elsewhere classified  Localized edema     Problem List Patient Active Problem List   Diagnosis Date Noted  . S/P left unicompartmental knee replacement 02/03/2020  . Acquired hypothyroidism 01/14/2020  . Osteoarthritis of left knee 12/30/2019  . Hyperglycemia 08/19/2019  . Annual physical exam 12/20/2018  . PCP NOTES >>>>>>>>>> 06/18/2018  . Thyroid disease 12/15/2017  . DJD (degenerative joint disease) 12/15/2017  . BPH with elevated PSA 12/15/2017  . Low grade B-cell lymphoma ([place]) 09/03/2015  . Family history of colon cancer 08/25/2015  . Hypercholesterolemia 11/02/2012  . Asthma 11/02/2012    Kyle Wise,Kyle Wise 02/18/2020, 3:42 PM  [place] [place]. [place], [place], [address] Phone: [phone]   Fax:  [phone]  Name: Kyle Wise MRN: [id number] Date of Birth: [birth year]

## 2020-02-20 ENCOUNTER — Encounter: Payer: Medicare HMO | Admitting: Physical Therapy

## 2020-02-23 ENCOUNTER — Other Ambulatory Visit: Payer: Self-pay

## 2020-02-23 ENCOUNTER — Ambulatory Visit: Payer: Medicare HMO | Admitting: Physical Therapy

## 2020-02-23 ENCOUNTER — Encounter: Payer: Self-pay | Admitting: Physical Therapy

## 2020-02-23 DIAGNOSIS — R600 Localized edema: Secondary | ICD-10-CM | POA: Diagnosis not present

## 2020-02-23 DIAGNOSIS — R262 Difficulty in walking, not elsewhere classified: Secondary | ICD-10-CM

## 2020-02-23 DIAGNOSIS — M25662 Stiffness of left knee, not elsewhere classified: Secondary | ICD-10-CM | POA: Diagnosis not present

## 2020-02-23 DIAGNOSIS — M25562 Pain in left knee: Secondary | ICD-10-CM

## 2020-02-23 NOTE — Therapy (Signed)
[place]. [place], [place], [address] Phone: [phone]   Fax:  [phone]  Physical Therapy Treatment  Patient Details  Name: Kyle Wise MRN: [id number] Date of Birth: [birth year] Referring Provider (PT): Larose Kells   Encounter Date: 02/23/2020   PT End of Session - 02/23/20 1219    Visit Number 7    Date for PT Re-Evaluation 04/06/20    PT Start Time 1140    PT Stop Time 1230    PT Time Calculation (min) 50 min    Activity Tolerance Patient tolerated treatment well    Behavior During Therapy [facility] for tasks assessed/performed           Past Medical History:  Diagnosis Date   Allergy    seasonal   Anemia    Anxiety    Arthritis    Asthma    no meds now   Frequent UTI    History of colon polyps    Hyperlipidemia    Hypothyroidism    Left knee DJD    Legal blindness    Low grade B-cell lymphoma ([place]) 2005   low grade b-cell lymphoma   Osteoporosis     Past Surgical History:  Procedure Laterality Date   [place]  12/2008   meningioma    COLON SURGERY  1999   colon resection   EYE SURGERY     cataract   KNEE ARTHROSCOPY Left 05/2009   PARTIAL KNEE ARTHROPLASTY Left 02/03/2020   Procedure: UNICOMPARTMENTAL KNEE;  Surgeon: Marchia Bond, MD;  Location: [facility];  Service: Orthopedics;  Laterality: Left;  combo anestesia of adductor canal block and spinal   PROSTATE ABLATION  2009   "microwave procedure", Dr Hazle Nordmann   TONSILLECTOMY AND ADENOIDECTOMY  1945    There were no vitals filed for this visit.   Subjective Assessment - 02/23/20 1140    Subjective Kinda tired[facility]    Currently in Pain? Yes    Pain Score 2     Pain Location Knee    Pain Orientation Left                             [facility] Adult PT Treatment/Exercise - 02/23/20 0001      Knee/Hip Exercises: Aerobic   Recumbent Bike 4 min  full rev     Nustep L 5 6 min       Knee/Hip Exercises: Machines for Strengthening   Cybex Knee Extension 5lb 2x10     Cybex Knee Flexion LLE 10lb 2x10     Cybex Leg Press 40# 2 sets 10, LLE 20lb 2x10       Knee/Hip Exercises: Standing   Heel Raises Both;2 sets;10 reps;2 seconds    Forward Step Up Left;10 reps;Step Height: 6";Hand Hold: 1    Walking with Sports Cord 30lb 4 way x 3 each      Vasopneumatic   Number Minutes Vasopneumatic  10 minutes    Vasopnuematic Location  Knee    Vasopneumatic Pressure Medium    Vasopneumatic Temperature  34      Manual Therapy   Manual Therapy Passive ROM    Passive ROM flex and ext                    PT Short Term Goals - 02/18/20 1539  PT SHORT TERM GOAL #1   Title Pt will be independent with HEP    [facility] [employer] PT per pt report    Status Achieved             PT Long Term Goals - 02/18/20 1539      PT LONG TERM GOAL #1   Title Pt will demo L knee flexion 110 deg    Status Achieved      PT LONG TERM GOAL #2   Title Pt will demo full knee extension    Status Partially Met      PT LONG TERM GOAL #3   Title Pt will demo TUG <15 sec with proper form/alignment and no AD    Status Achieved                 Plan - 02/23/20 1219    Clinical Impression Statement Pt amb in without AD. He amb stiff legged in LLE despite having adequate ROM. He can correct gait with cues to bend L knee more. Some difficulty with SL extension but able to complete intervention. Cues needed to prevent compensation with step ups.    Personal Factors and Comorbidities Comorbidity 2    Comorbidities arthritis, legally blind    Examination-Activity Limitations Stand;Stairs;Locomotion Level    Examination-Participation Restrictions Community Activity;Interpersonal Relationship    Stability/Clinical Decision Making Evolving/Moderate complexity    Rehab Potential Good    PT Frequency 2x / week    PT  Treatment/Interventions ADLs/Self Care Home Management;Electrical Stimulation;Neuromuscular re-education;Balance training;Therapeutic exercise;Therapeutic activities;Functional mobility training;Stair training;Gait training;Patient/family education;Manual techniques;Passive range of motion;Vasopneumatic Device    PT Next Visit Plan gait, TKE           Patient will benefit from skilled therapeutic intervention in order to improve the following deficits and impairments:  Abnormal gait, Decreased range of motion, Difficulty walking, Decreased endurance, Decreased activity tolerance, Pain, Hypomobility, Increased edema, Postural dysfunction  Visit Diagnosis: Localized edema  Difficulty in walking, not elsewhere classified  Stiffness of left knee, not elsewhere classified  Acute pain of left knee     Problem List Patient Active Problem List   Diagnosis Date Noted   S/P left unicompartmental knee replacement 02/03/2020   Acquired hypothyroidism 01/14/2020   Osteoarthritis of left knee 12/30/2019   Hyperglycemia 08/19/2019   Annual physical exam 12/20/2018   PCP NOTES >>>>>>>>>> 06/18/2018   Thyroid disease 12/15/2017   DJD (degenerative joint disease) 12/15/2017   BPH with elevated PSA 12/15/2017   Low grade B-cell lymphoma ([place]) 09/03/2015   Family history of colon cancer 08/25/2015   Hypercholesterolemia 11/02/2012   Asthma 11/02/2012    Scot Jun, PTA 02/23/2020, 12:24 PM  [place] [place]. [place], [place], [address] Phone: [phone]   Fax:  [phone]  Name: Kyle Wise MRN: [id number] Date of Birth: 1941/01/26

## 2020-02-25 ENCOUNTER — Encounter: Payer: Self-pay | Admitting: Physical Therapy

## 2020-02-25 ENCOUNTER — Other Ambulatory Visit: Payer: Self-pay

## 2020-02-25 ENCOUNTER — Ambulatory Visit: Payer: Medicare HMO | Admitting: Physical Therapy

## 2020-02-25 DIAGNOSIS — R600 Localized edema: Secondary | ICD-10-CM

## 2020-02-25 DIAGNOSIS — M25562 Pain in left knee: Secondary | ICD-10-CM

## 2020-02-25 DIAGNOSIS — M25662 Stiffness of left knee, not elsewhere classified: Secondary | ICD-10-CM | POA: Diagnosis not present

## 2020-02-25 DIAGNOSIS — R262 Difficulty in walking, not elsewhere classified: Secondary | ICD-10-CM | POA: Diagnosis not present

## 2020-02-25 NOTE — Therapy (Signed)
[place]. [place], [place], [address] Phone: [phone]   Fax:  [phone]  Physical Therapy Treatment  Patient Details  Name: Kyle Wise MRN: [id number] Date of Birth: [birth year] Referring Provider (PT): Larose Kells   Encounter Date: 02/25/2020   PT End of Session - 02/25/20 1004    Visit Number 8    Date for PT Re-Evaluation 04/06/20    PT Start Time 0930    PT Stop Time 1021    PT Time Calculation (min) 51 min    Activity Tolerance Patient tolerated treatment well    Behavior During Therapy [facility] for tasks assessed/performed           Past Medical History:  Diagnosis Date  . Allergy    seasonal  . Anemia   . Anxiety   . Arthritis   . Asthma    no meds now  . Frequent UTI   . History of colon polyps   . Hyperlipidemia   . Hypothyroidism   . Left knee DJD   . Legal blindness   . Low grade B-cell lymphoma ([place]) 2005   low grade b-cell lymphoma  . Osteoporosis     Past Surgical History:  Procedure Laterality Date  . APPENDECTOMY  1999  . BRAIN SURGERY  12/2008   meningioma   . COLON SURGERY  1999   colon resection  . EYE SURGERY     cataract  . KNEE ARTHROSCOPY Left 05/2009  . PARTIAL KNEE ARTHROPLASTY Left 02/03/2020   Procedure: UNICOMPARTMENTAL KNEE;  Surgeon: Marchia Bond, MD;  Location: [facility];  Service: Orthopedics;  Laterality: Left;  combo anestesia of adductor canal block and spinal  . PROSTATE ABLATION  2009   "microwave procedure", Dr Hazle Nordmann  . TONSILLECTOMY AND ADENOIDECTOMY  1945    There were no vitals filed for this visit.   Subjective Assessment - 02/25/20 0931    Subjective I am doing pretty good, just stiff int he morning    Currently in Pain? Yes    Pain Score 2     Pain Location Knee    Pain Orientation Left    Aggravating Factors  stiff in the mornings              [facility] PT Assessment - 02/25/20 0001      PROM   Left Knee Flexion 120                          [facility] Adult PT Treatment/Exercise - 02/25/20 0001      Knee/Hip Exercises: Aerobic   Recumbent Bike 5 min full rev     Nustep L 5 6 min       Knee/Hip Exercises: Machines for Strengthening   Cybex Knee Extension 5lb 2x10     Cybex Knee Flexion LLE 10lb 2x10     Cybex Leg Press 40# 2 sets 10, LLE 20lb 2x10       Knee/Hip Exercises: Supine   Other Supine Knee/Hip Exercises feet on ball K2C, bridges      Vasopneumatic   Number Minutes Vasopneumatic  10 minutes    Vasopnuematic Location  Knee    Vasopneumatic Pressure Medium      Manual Therapy   Manual Therapy Passive ROM    Passive ROM flex and ext                    PT  Short Term Goals - 02/18/20 1539      PT SHORT TERM GOAL #1   Title Pt will be independent with HEP    [facility] [employer] PT per pt report    Status Achieved             PT Long Term Goals - 02/18/20 1539      PT LONG TERM GOAL #1   Title Pt will demo L knee flexion 110 deg    Status Achieved      PT LONG TERM GOAL #2   Title Pt will demo full knee extension    Status Partially Met      PT LONG TERM GOAL #3   Title Pt will demo TUG <15 sec with proper form/alignment and no AD    Status Achieved                 Plan - 02/25/20 1004    Clinical Impression Statement Patient doing well, just stiff when he comes in, reports pain a 2-3/10, he is walking without device, slightly stiff legged, ROM is improving, still with streistrips in place    PT Next Visit Plan continue to work on function    Consulted and Agree with Plan of Care Patient           Patient will benefit from skilled therapeutic intervention in order to improve the following deficits and impairments:  Abnormal gait, Decreased range of motion, Difficulty walking, Decreased endurance, Decreased activity tolerance, Pain, Hypomobility, Increased edema, Postural dysfunction  Visit Diagnosis: Localized  edema  Difficulty in walking, not elsewhere classified  Stiffness of left knee, not elsewhere classified  Acute pain of left knee     Problem List Patient Active Problem List   Diagnosis Date Noted  . S/P left unicompartmental knee replacement 02/03/2020  . Acquired hypothyroidism 01/14/2020  . Osteoarthritis of left knee 12/30/2019  . Hyperglycemia 08/19/2019  . Annual physical exam 12/20/2018  . PCP NOTES >>>>>>>>>> 06/18/2018  . Thyroid disease 12/15/2017  . DJD (degenerative joint disease) 12/15/2017  . BPH with elevated PSA 12/15/2017  . Low grade B-cell lymphoma ([place]) 09/03/2015  . Family history of colon cancer 08/25/2015  . Hypercholesterolemia 11/02/2012  . Asthma 11/02/2012    Kyle Wise., PT 02/25/2020, 10:06 AM  [place]. [place], [place], [address] Phone: [phone]   Fax:  [phone]  Name: Kyle Wise MRN: [id number] Date of Birth: 16-Sep-1940

## 2020-02-26 DIAGNOSIS — R7303 Prediabetes: Secondary | ICD-10-CM | POA: Diagnosis not present

## 2020-02-26 DIAGNOSIS — E039 Hypothyroidism, unspecified: Secondary | ICD-10-CM | POA: Diagnosis not present

## 2020-02-26 DIAGNOSIS — E785 Hyperlipidemia, unspecified: Secondary | ICD-10-CM | POA: Diagnosis not present

## 2020-02-26 DIAGNOSIS — R5383 Other fatigue: Secondary | ICD-10-CM | POA: Diagnosis not present

## 2020-02-26 DIAGNOSIS — R861 Abnormal level of hormones in specimens from male genital organs: Secondary | ICD-10-CM | POA: Diagnosis not present

## 2020-02-26 DIAGNOSIS — E790 Hyperuricemia without signs of inflammatory arthritis and tophaceous disease: Secondary | ICD-10-CM | POA: Diagnosis not present

## 2020-02-26 DIAGNOSIS — E611 Iron deficiency: Secondary | ICD-10-CM | POA: Diagnosis not present

## 2020-02-26 DIAGNOSIS — E7800 Pure hypercholesterolemia, unspecified: Secondary | ICD-10-CM | POA: Diagnosis not present

## 2020-02-26 DIAGNOSIS — R3912 Poor urinary stream: Secondary | ICD-10-CM | POA: Diagnosis not present

## 2020-02-26 LAB — LIPID PANEL
Cholesterol: 194 (ref 0–200)
HDL: 44 (ref 35–70)
LDL Cholesterol: 131
Triglycerides: 103 (ref 40–160)

## 2020-02-26 LAB — HEPATIC FUNCTION PANEL
ALT: 20 U/L (ref 10–40)
AST: 22 (ref 14–40)
Alkaline Phosphatase: 80 (ref 25–125)
Bilirubin, Total: 0.7

## 2020-02-26 LAB — BASIC METABOLIC PANEL
BUN: 20 (ref 4–21)
CO2: 25 — AB (ref 13–22)
Chloride: 102 (ref 99–108)
Creatinine: 1.2 (ref 0.6–1.3)
Glucose: 108
Potassium: 4.6 mEq/L (ref 3.5–5.1)
Sodium: 140 (ref 137–147)

## 2020-02-26 LAB — CBC AND DIFFERENTIAL
HCT: 47 (ref 41–53)
Hemoglobin: 15.6 (ref 13.5–17.5)
Neutrophils Absolute: 4.3
Platelets: 167 10*3/uL (ref 150–400)
WBC: 6.1

## 2020-02-26 LAB — CBC: RBC: 5.8 — AB (ref 3.87–5.11)

## 2020-02-26 LAB — COMPREHENSIVE METABOLIC PANEL
Albumin: 4.5 (ref 3.5–5.0)
Calcium: 9.2 (ref 8.7–10.7)
Globulin: 2
eGFR: 57

## 2020-02-26 LAB — TSH: TSH: 3 (ref 0.41–5.90)

## 2020-02-26 LAB — TESTOSTERONE: Testosterone: 612

## 2020-02-27 ENCOUNTER — Ambulatory Visit: Payer: Medicare HMO | Attending: Internal Medicine | Admitting: Physical Therapy

## 2020-02-27 ENCOUNTER — Other Ambulatory Visit: Payer: Self-pay

## 2020-02-27 ENCOUNTER — Encounter: Payer: Self-pay | Admitting: Physical Therapy

## 2020-02-27 DIAGNOSIS — M25662 Stiffness of left knee, not elsewhere classified: Secondary | ICD-10-CM

## 2020-02-27 DIAGNOSIS — M25562 Pain in left knee: Secondary | ICD-10-CM | POA: Diagnosis not present

## 2020-02-27 DIAGNOSIS — R262 Difficulty in walking, not elsewhere classified: Secondary | ICD-10-CM | POA: Diagnosis not present

## 2020-02-27 DIAGNOSIS — R600 Localized edema: Secondary | ICD-10-CM

## 2020-02-27 NOTE — Therapy (Signed)
[place]. [place], [place], [address] Phone: [phone]   Fax:  [phone]  Physical Therapy Treatment  Patient Details  Name: Kyle Wise MRN: [id number] Date of Birth: Jun 07, 1940 Referring Provider (PT): Nevin Bloodgood Date: 02/27/2020   PT End of Session - 02/27/20 1005    Visit Number 9    Date for PT Re-Evaluation 04/06/20    PT Start Time 0930    PT Stop Time 1023    PT Time Calculation (min) 53 min    Activity Tolerance Patient tolerated treatment well    Behavior During Therapy [facility] for tasks assessed/performed           Past Medical History:  Diagnosis Date  . Allergy    seasonal  . Anemia   . Anxiety   . Arthritis   . Asthma    no meds now  . Frequent UTI   . History of colon polyps   . Hyperlipidemia   . Hypothyroidism   . Left knee DJD   . Legal blindness   . Low grade B-cell lymphoma ([place]) 2005   low grade b-cell lymphoma  . Osteoporosis     Past Surgical History:  Procedure Laterality Date  . APPENDECTOMY  1999  . BRAIN SURGERY  12/2008   meningioma   . COLON SURGERY  1999   colon resection  . EYE SURGERY     cataract  . KNEE ARTHROSCOPY Left 05/2009  . PARTIAL KNEE ARTHROPLASTY Left 02/03/2020   Procedure: UNICOMPARTMENTAL KNEE;  Surgeon: Marchia Bond, MD;  Location: [facility];  Service: Orthopedics;  Laterality: Left;  combo anestesia of adductor canal block and spinal  . PROSTATE ABLATION  2009   "microwave procedure", Dr Hazle Nordmann  . TONSILLECTOMY AND ADENOIDECTOMY  1945    There were no vitals filed for this visit.   Subjective Assessment - 02/27/20 0937    Subjective Doing pretty good    Currently in Pain? Yes    Pain Score 2     Pain Location Knee    Pain Orientation Left    Pain Descriptors / Indicators Tightness;Sore                             [facility] Adult PT Treatment/Exercise - 02/27/20 0001      Ambulation/Gait    Gait Comments stairs step over step,, gait outside on uneven terrain working on safety      Knee/Hip Exercises: Aerobic   Recumbent Bike 5 min full rev     Nustep L 5 6 min       Knee/Hip Exercises: Machines for Strengthening   Cybex Knee Extension 5lb 3x10     Cybex Knee Flexion 25# 3x10    Cybex Leg Press 40# 2 sets 10, LLE 20lb 2x10       Vasopneumatic   Number Minutes Vasopneumatic  10 minutes    Vasopnuematic Location  Knee    Vasopneumatic Pressure Medium    Vasopneumatic Temperature  34                    PT Short Term Goals - 02/18/20 1539      PT SHORT TERM GOAL #1   Title Pt will be independent with HEP    [facility] [employer] PT per pt report    Status Achieved  PT Long Term Goals - 02/27/20 1007      PT LONG TERM GOAL #1   Title Pt will demo L knee flexion 110 deg    Status Partially Met                 Plan - 02/27/20 1005    Clinical Impression Statement Worked on gait on uneven surfaces, he did well with his poor vision but this is something that will need to be worked on for his safery.  Did better with ROM less stiff, he still tends to walk with a stiff legged gait    PT Next Visit Plan continue to work on function    Consulted and Agree with Plan of Care Patient           Patient will benefit from skilled therapeutic intervention in order to improve the following deficits and impairments:  Abnormal gait, Decreased range of motion, Difficulty walking, Decreased endurance, Decreased activity tolerance, Pain, Hypomobility, Increased edema, Postural dysfunction  Visit Diagnosis: Localized edema  Difficulty in walking, not elsewhere classified  Stiffness of left knee, not elsewhere classified  Acute pain of left knee     Problem List Patient Active Problem List   Diagnosis Date Noted  . S/P left unicompartmental knee replacement 02/03/2020  . Acquired hypothyroidism 01/14/2020  .  Osteoarthritis of left knee 12/30/2019  . Hyperglycemia 08/19/2019  . Annual physical exam 12/20/2018  . PCP NOTES >>>>>>>>>> 06/18/2018  . Thyroid disease 12/15/2017  . DJD (degenerative joint disease) 12/15/2017  . BPH with elevated PSA 12/15/2017  . Low grade B-cell lymphoma ([place]) 09/03/2015  . Family history of colon cancer 08/25/2015  . Hypercholesterolemia 11/02/2012  . Asthma 11/02/2012    Sumner Boast., PT 02/27/2020, 10:07 AM  [place]. [place], [place], [address] Phone: [phone]   Fax:  [phone]  Name: Kyle Wise MRN: [id number] Date of Birth: August 21, 1940

## 2020-03-01 ENCOUNTER — Ambulatory Visit: Payer: Medicare HMO | Admitting: Physical Therapy

## 2020-03-01 ENCOUNTER — Other Ambulatory Visit: Payer: Self-pay

## 2020-03-01 ENCOUNTER — Encounter: Payer: Self-pay | Admitting: Physical Therapy

## 2020-03-01 DIAGNOSIS — R600 Localized edema: Secondary | ICD-10-CM

## 2020-03-01 DIAGNOSIS — M25662 Stiffness of left knee, not elsewhere classified: Secondary | ICD-10-CM | POA: Diagnosis not present

## 2020-03-01 DIAGNOSIS — R262 Difficulty in walking, not elsewhere classified: Secondary | ICD-10-CM | POA: Diagnosis not present

## 2020-03-01 DIAGNOSIS — M25562 Pain in left knee: Secondary | ICD-10-CM | POA: Diagnosis not present

## 2020-03-01 NOTE — Therapy (Signed)
[place] [place], [place], [address] Phone: [phone]   Fax:  [phone] Progress Note Reporting Period 02/05/20 to 03/01/20 for the first 10 visits  See note below for Objective Data and Assessment of Progress/Goals.      Physical Therapy Treatment  Patient Details  Name: Kyle Wise MRN: [id number] Date of Birth: 1941-01-14 Referring Provider (PT): Nevin Bloodgood Date: 03/01/2020   PT End of Session - 03/01/20 1641    Visit Number 10    Date for PT Re-Evaluation 04/06/20    PT Start Time 1600    PT Stop Time 1650    PT Time Calculation (min) 50 min    Activity Tolerance Patient tolerated treatment well    Behavior During Therapy [facility] for tasks assessed/performed           Past Medical History:  Diagnosis Date  . Allergy    seasonal  . Anemia   . Anxiety   . Arthritis   . Asthma    no meds now  . Frequent UTI   . History of colon polyps   . Hyperlipidemia   . Hypothyroidism   . Left knee DJD   . Legal blindness   . Low grade B-cell lymphoma ([place]) 2005   low grade b-cell lymphoma  . Osteoporosis     Past Surgical History:  Procedure Laterality Date  . APPENDECTOMY  1999  . BRAIN SURGERY  12/2008   meningioma   . COLON SURGERY  1999   colon resection  . EYE SURGERY     cataract  . KNEE ARTHROSCOPY Left 05/2009  . PARTIAL KNEE ARTHROPLASTY Left 02/03/2020   Procedure: UNICOMPARTMENTAL KNEE;  Surgeon: Marchia Bond, MD;  Location: [place];  Service: Orthopedics;  Laterality: Left;  combo anestesia of adductor canal block and spinal  . PROSTATE ABLATION  2009   "microwave procedure", Dr Hazle Nordmann  . TONSILLECTOMY AND ADENOIDECTOMY  1945    There were no vitals filed for this visit.   Subjective Assessment - 03/01/20 1558    Subjective "Doing good, tired of hurting"    Currently in Pain? Yes    Pain Score 2     Pain Location Knee    Pain Orientation Left               [facility] PT Assessment - 03/01/20 0001      AROM   Left Knee Extension 3    Left Knee Flexion 115                         [facility] Adult PT Treatment/Exercise - 03/01/20 0001      Knee/Hip Exercises: Aerobic   Recumbent Bike L1x 6 min    Nustep L 5 5 min       Knee/Hip Exercises: Machines for Strengthening   Cybex Knee Extension 5lb 2x10 LLE     Cybex Leg Press 50# 2 sets 10, LLE 30lb 2x10       Knee/Hip Exercises: Standing   Heel Raises Both;2 sets;2 seconds;15 reps    Lateral Step Up Left;10 reps;Step Height: 6";1 set    Forward Step Up Left;10 reps;Step Height: 6";Hand Hold: 1;1 set    Walking with Sports Cord 40lb 4 way x 3 each      Vasopneumatic   Number Minutes Vasopneumatic  10 minutes    Vasopnuematic Location  Knee    Vasopneumatic Pressure  Medium    Vasopneumatic Temperature  34      Manual Therapy   Manual Therapy Passive ROM    Passive ROM flex and ext                    PT Short Term Goals - 02/18/20 1539      PT SHORT TERM GOAL #1   Title Pt will be independent with HEP    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 No reports of pain throughout session. Some end range extension weakness noted with LLE extensions and step ups. Cues not to drag LLE with resisted gait.    Personal Factors and Comorbidities Comorbidity 2    Comorbidities arthritis, legally blind     Examination-Activity Limitations Stand;Stairs;Locomotion Level    Examination-Participation Restrictions Community Activity;Interpersonal Relationship    Rehab Potential Good    PT Frequency 2x / week    PT Duration 8 weeks    PT Treatment/Interventions ADLs/Self Care Home Management;Electrical Stimulation;Neuromuscular re-education;Balance training;Therapeutic exercise;Therapeutic activities;Functional mobility training;Stair training;Gait training;Patient/family education;Manual techniques;Passive range of motion;Vasopneumatic Device    PT Next Visit Plan continue to work on function           Patient will benefit from skilled therapeutic intervention in order to improve the following deficits and impairments:  Abnormal gait, Decreased range of motion, Difficulty walking, Decreased endurance, Decreased activity tolerance, Pain, Hypomobility, Increased edema, Postural dysfunction  Visit Diagnosis: Localized edema  Difficulty in walking, not elsewhere classified  Stiffness of left knee, not elsewhere classified     Problem List Patient Active Problem List   Diagnosis Date Noted  . S/P left unicompartmental knee replacement 02/03/2020  . Acquired hypothyroidism 01/14/2020  . Osteoarthritis of left knee 12/30/2019  . Hyperglycemia 08/19/2019  . Annual physical exam 12/20/2018  . PCP NOTES >>>>>>>>>> 06/18/2018  . Thyroid disease 12/15/2017  . DJD (degenerative joint disease) 12/15/2017  . BPH with elevated PSA 12/15/2017  . Low grade B-cell lymphoma ([place]) 09/03/2015  . Family history of colon cancer 08/25/2015  . Hypercholesterolemia 11/02/2012  . Asthma 11/02/2012    Scot Jun, PTA 03/01/2020, 4:46 PM  [place]. [place], [place], [address] Phone: [phone]   Fax:  [phone]  Name: Kyle Wise MRN: [id number] Date of Birth: [birth year]

## 2020-03-04 ENCOUNTER — Ambulatory Visit: Payer: Medicare HMO | Admitting: Physical Therapy

## 2020-03-04 ENCOUNTER — Other Ambulatory Visit: Payer: Self-pay

## 2020-03-04 ENCOUNTER — Encounter: Payer: Self-pay | Admitting: Physical Therapy

## 2020-03-04 DIAGNOSIS — M25562 Pain in left knee: Secondary | ICD-10-CM | POA: Diagnosis not present

## 2020-03-04 DIAGNOSIS — R262 Difficulty in walking, not elsewhere classified: Secondary | ICD-10-CM | POA: Diagnosis not present

## 2020-03-04 DIAGNOSIS — M25662 Stiffness of left knee, not elsewhere classified: Secondary | ICD-10-CM

## 2020-03-04 DIAGNOSIS — R600 Localized edema: Secondary | ICD-10-CM

## 2020-03-04 NOTE — Therapy (Signed)
[place]. [place], [place], [address] Phone: [phone]   Fax:  [phone]  Physical Therapy Treatment  Patient Details  Name: Kyle Wise MRN: [id number] Date of Birth: 09-12-1940 Referring Provider (PT): Larose Kells   Encounter Date: 03/04/2020   PT End of Session - 03/04/20 1330    Visit Number 11    Date for PT Re-Evaluation 04/06/20    PT Start Time 1255    PT Stop Time 1340    PT Time Calculation (min) 45 min    Activity Tolerance Patient tolerated treatment well    Behavior During Therapy [facility] for tasks assessed/performed           Past Medical History:  Diagnosis Date  . Allergy    seasonal  . Anemia   . Anxiety   . Arthritis   . Asthma    no meds now  . Frequent UTI   . History of colon polyps   . Hyperlipidemia   . Hypothyroidism   . Left knee DJD   . Legal blindness   . Low grade B-cell lymphoma ([place]) 2005   low grade b-cell lymphoma  . Osteoporosis     Past Surgical History:  Procedure Laterality Date  . APPENDECTOMY  1999  . BRAIN SURGERY  12/2008   meningioma   . COLON SURGERY  1999   colon resection  . EYE SURGERY     cataract  . KNEE ARTHROSCOPY Left 05/2009  . PARTIAL KNEE ARTHROPLASTY Left 02/03/2020   Procedure: UNICOMPARTMENTAL KNEE;  Surgeon: Marchia Bond, MD;  Location: [place];  Service: Orthopedics;  Laterality: Left;  combo anestesia of adductor canal block and spinal  . PROSTATE ABLATION  2009   "microwave procedure", Dr Hazle Nordmann  . TONSILLECTOMY AND ADENOIDECTOMY  1945    There were no vitals filed for this visit.   Subjective Assessment - 03/04/20 1300    Subjective "I feel pretty good"    Patient Stated Goals regain knee motion, reduce pain    Currently in Pain? No/denies                             [facility] Adult PT Treatment/Exercise - 03/04/20 0001      Knee/Hip Exercises: Aerobic   Nustep L 6 7 min LE only         Knee/Hip Exercises: Machines for Strengthening   Cybex Knee Extension 5lb 2x10 LLE     Cybex Knee Flexion 25# 2x10; 15lb LLE x10     Cybex Leg Press 50# 2 sets 10, LLE 30lb 2x10       Knee/Hip Exercises: Standing   Heel Raises Both;2 sets;2 seconds;15 reps    Lateral Step Up Left;10 reps;Step Height: 6";1 set    Forward Step Up Left;10 reps;Step Height: 6";Hand Hold: 1;1 set      Vasopneumatic   Number Minutes Vasopneumatic  10 minutes    Vasopnuematic Location  Knee    Vasopneumatic Pressure Medium    Vasopneumatic Temperature  34                    PT Short Term Goals - 02/18/20 1539      PT SHORT TERM GOAL #1   Title Pt will be independent with HEP    Baseline at Y doing machines [employer] PT per pt report    Status Achieved  PT Long Term Goals - 03/01/20 1645      PT LONG TERM GOAL #1   Title Pt will demo L knee flexion 110 deg    Status Achieved      PT LONG TERM GOAL #2   Title Pt will demo full knee extension    Status Partially Met      PT LONG TERM GOAL #3   Title Pt will demo TUG <15 sec with proper form/alignment and no AD    Status Achieved                 Plan - 03/04/20 1331    Clinical Impression Statement All interventions completed well. Cue for full TKE with step up interventions. No report of increase pain. Some difficulty with SL extensions. Cues for pacing needed on leg press not to go too fast.    Personal Factors and Comorbidities Comorbidity 2    Comorbidities arthritis, legally blind    Examination-Activity Limitations Stand;Stairs;Locomotion Level    Examination-Participation Restrictions Community Activity;Interpersonal Relationship    Stability/Clinical Decision Making Evolving/Moderate complexity    Rehab Potential Good    PT Frequency 2x / week    PT Duration 8 weeks    PT Treatment/Interventions ADLs/Self Care Home Management;Electrical Stimulation;Neuromuscular re-education;Balance training;Therapeutic  exercise;Therapeutic activities;Functional mobility training;Stair training;Gait training;Patient/family education;Manual techniques;Passive range of motion;Vasopneumatic Device    PT Next Visit Plan continue to work on function           Patient will benefit from skilled therapeutic intervention in order to improve the following deficits and impairments:  Abnormal gait, Decreased range of motion, Difficulty walking, Decreased endurance, Decreased activity tolerance, Pain, Hypomobility, Increased edema, Postural dysfunction  Visit Diagnosis: Difficulty in walking, not elsewhere classified  Acute pain of left knee  Stiffness of left knee, not elsewhere classified  Localized edema     Problem List Patient Active Problem List   Diagnosis Date Noted  . S/P left unicompartmental knee replacement 02/03/2020  . Acquired hypothyroidism 01/14/2020  . Osteoarthritis of left knee 12/30/2019  . Hyperglycemia 08/19/2019  . Annual physical exam 12/20/2018  . PCP NOTES >>>>>>>>>> 06/18/2018  . Thyroid disease 12/15/2017  . DJD (degenerative joint disease) 12/15/2017  . BPH with elevated PSA 12/15/2017  . Low grade B-cell lymphoma ([place]) 09/03/2015  . Family history of colon cancer 08/25/2015  . Hypercholesterolemia 11/02/2012  . Asthma 11/02/2012    Scot Jun 03/04/2020, 1:32 PM  [place]. [place], [place], [address] Phone: [phone]   Fax:  [phone]  Name: Kyle Wise MRN: [id number] Date of Birth: 17-Mar-1941

## 2020-03-15 DIAGNOSIS — M1712 Unilateral primary osteoarthritis, left knee: Secondary | ICD-10-CM | POA: Diagnosis not present

## 2020-03-25 ENCOUNTER — Other Ambulatory Visit: Payer: Self-pay

## 2020-03-25 ENCOUNTER — Encounter: Payer: Self-pay | Admitting: Internal Medicine

## 2020-03-25 ENCOUNTER — Ambulatory Visit (INDEPENDENT_AMBULATORY_CARE_PROVIDER_SITE_OTHER): Payer: Medicare HMO | Admitting: Internal Medicine

## 2020-03-25 VITALS — BP 146/69 | HR 57 | Temp 97.6°F | Resp 18 | Ht 68.0 in | Wt 157.0 lb

## 2020-03-25 DIAGNOSIS — E079 Disorder of thyroid, unspecified: Secondary | ICD-10-CM

## 2020-03-25 DIAGNOSIS — R634 Abnormal weight loss: Secondary | ICD-10-CM | POA: Diagnosis not present

## 2020-03-25 NOTE — Progress Notes (Signed)
Pre visit review using our clinic review tool, if applicable. No additional management support is needed unless otherwise documented below in the visit note. 

## 2020-03-25 NOTE — Progress Notes (Signed)
Subjective:    Patient ID: Kyle Wise, male    DOB: July 31, 1940, 79 y.o.   MRN: [id number]  DOS:  03/25/2020  Type of visit - description: Follow-up Since the last office visit is doing well. Had a knee replacement, still sore but seems to be recuperating as expected. Saw endocrinology and oncology, notes reviewed  Wt Readings from Last 3 Encounters:  03/25/20 157 lb (71.2 kg)  02/03/20 154 lb 8.7 oz (70.1 kg)  01/13/20 153 lb 9.6 oz (69.7 kg)    Review of Systems Had diarrhea, resolved. Denies fever chills No night sweats  Past Medical History:  Diagnosis Date  . Allergy    seasonal  . Anemia   . Anxiety   . Arthritis   . Asthma    no meds now  . Frequent UTI   . History of colon polyps   . Hyperlipidemia   . Hypothyroidism   . Left knee DJD   . Legal blindness   . Low grade B-cell lymphoma ([place]) 2005   low grade b-cell lymphoma  . Osteoporosis     Past Surgical History:  Procedure Laterality Date  . APPENDECTOMY  1999  . BRAIN SURGERY  12/2008   meningioma   . COLON SURGERY  1999   colon resection  . EYE SURGERY     cataract  . KNEE ARTHROSCOPY Left 05/2009  . PARTIAL KNEE ARTHROPLASTY Left 02/03/2020   Procedure: UNICOMPARTMENTAL KNEE;  Surgeon: Marchia Bond, MD;  Location: [place];  Service: Orthopedics;  Laterality: Left;  combo anestesia of adductor canal block and spinal  . PROSTATE ABLATION  2009   "microwave procedure", Dr Hazle Nordmann  . TONSILLECTOMY AND ADENOIDECTOMY  1945    Allergies as of 03/25/2020   No Known Allergies     Medication List       Accurate as of March 25, 2020 10:15 AM. If you have any questions, ask your nurse or doctor.        STOP taking these medications   aspirin EC 325 MG tablet Stopped by: Kathlene November, MD   baclofen 10 MG tablet Commonly known as: LIORESAL Stopped by: Kathlene November, MD   HYDROcodone-acetaminophen 10-325 MG tablet Commonly known as: Norco Stopped by: Kathlene November, MD    ondansetron 4 MG tablet Commonly known as: Zofran Stopped by: Kathlene November, MD   sennosides-docusate sodium 8.6-50 MG tablet Commonly known as: SENOKOT-S Stopped by: Kathlene November, MD     TAKE these medications   levothyroxine 50 MCG tablet Commonly known as: SYNTHROID Take 1 tablet (50 mcg total) by mouth daily.   melatonin 5 MG Tabs Take 5 mg by mouth at bedtime.   multivitamin with minerals Tabs tablet Take 1 tablet by mouth daily.   tamsulosin 0.4 MG Caps capsule Commonly known as: FLOMAX Take 0.4 mg by mouth 2 (two) times daily.   vitamin C 1000 MG tablet Take 1,000 mg by mouth daily.          Objective:   Physical Exam BP (!) 146/69 (BP Location: Left Arm, Patient Position: Sitting, Cuff Size: Small)   Pulse (!) 57   Temp 97.6 F (36.4 C) (Oral)   Resp 18   Ht 5\' 8"  (1.727 m)   Wt 157 lb (71.2 kg)   SpO2 97%   BMI 23.87 kg/m  General:   Well developed, NAD, BMI noted. HEENT:  Normocephalic . Face symmetric, atraumatic Lungs:  CTA B Normal respiratory effort, no intercostal retractions,  no accessory muscle use. Heart: RRR,  no murmur.  Lower extremities: no pretibial edema bilaterally  Skin: Not pale. Not jaundice Neurologic:  alert & oriented X3.  Speech normal, gait unassisted, slightly limited still by knee pain Psych--  Cognition and judgment appear intact.  Cooperative with normal attention span and concentration.  Behavior appropriate. No anxious or depressed appearing.      Assessment     ASSESSMENT  Hyperglycemia High cholesterol Thyroid disease: on citomel/armour rx by   Kirk Ruths NP Asthma DJD, history of knee arthroscopy 2011 Low-grade B-cell lymphoma, hematology [facility] GU: Elevated PSA, BPH, LUTS, ureteral stricture --TUMT September 2006 Dr. Burnell Blanks --Recurrent UTIs due to Citrobacter despite appropriate antibiotics. Not Rx for asx bacteriuria --Elevated PSA, range between 3 and 7, prostate biopsy 2005, + basal cell hyperplasia without  malignancy -- Prostate MRI 08/2016 with no evidence of high-grade prostate cancer. Dr Alinda Money Legally blind due to meningioma surgery Colon polyp: s/p partial colectomy 1999, Dr Dorrene German Naltroxen: rx by Kirk Ruths NP Used to see Margarita Mail NP (holistic NP)  PLAN: Weight loss, diarrhea: Since the last visit, TSH was suppressed iatrogenically, stool test showed no infection, thyroid meds adjusted, has gained 3 pounds. Thyroid disease: Since last visit, TSH was suppressed iatrogenically, saw Endo, Armour Thyroid is stopped, now only on levothyroxine. H/o lymphoma: Last visit with oncology 12/30/2019  DJD: Had a knee replacement, recovering well. Had COVID vaccine x3 Had a flu shot already RTC 3 months (to monitor weight and recheck TSH)    This visit occurred during the SARS-CoV-2 public health emergency.  Safety protocols were in place, including screening questions prior to the visit, additional usage of staff PPE, and extensive cleaning of exam room while observing appropriate contact time as indicated for disinfecting solutions.

## 2020-03-25 NOTE — Assessment & Plan Note (Signed)
Weight loss, diarrhea: Since the last visit, TSH was suppressed iatrogenically, stool test showed no infection, thyroid meds adjusted, has gained 3 pounds. Thyroid disease: Since last visit, TSH was suppressed iatrogenically, saw Endo, Armour Thyroid is stopped, now only on levothyroxine. H/o lymphoma: Last visit with oncology 12/30/2019  DJD: Had a knee replacement, recovering well. Had COVID vaccine x3 Had a flu shot already RTC 3 months (to monitor weight and recheck TSH)

## 2020-03-25 NOTE — Patient Instructions (Addendum)
Happy early Rudene Anda!  Check the  blood pressure once a month BP GOAL is between 110/65 and  135/85. If it is consistently higher or lower, let me know   GO TO THE FRONT DESK, [place] back for a checkup in 3 months

## 2020-04-13 ENCOUNTER — Ambulatory Visit: Payer: Medicare HMO | Admitting: Internal Medicine

## 2020-04-13 ENCOUNTER — Other Ambulatory Visit: Payer: Self-pay

## 2020-04-13 VITALS — BP 122/66 | HR 75 | Temp 97.7°F | Resp 12 | Ht 68.0 in | Wt 158.6 lb

## 2020-04-13 DIAGNOSIS — E039 Hypothyroidism, unspecified: Secondary | ICD-10-CM

## 2020-04-13 LAB — TSH: TSH: 1.83 u[IU]/mL (ref 0.35–4.50)

## 2020-04-13 MED ORDER — LEVOTHYROXINE SODIUM 50 MCG PO TABS
50.0000 ug | ORAL_TABLET | Freq: Every day | ORAL | 3 refills | Status: DC
Start: 2020-04-13 — End: 2020-05-20

## 2020-04-13 NOTE — Patient Instructions (Signed)
-   Continue Levothyroxine 50 mcg daily

## 2020-04-13 NOTE — Progress Notes (Signed)
Name: Kyle Wise  MRN/ DOB: [id number], May 10, 1941    Age/ Sex: 79 y.o., male     PCP: Colon Branch, MD   Reason for Endocrinology Evaluation: Hypothyroidism     Initial Endocrinology Clinic Visit: 01/13/2020    PATIENT IDENTIFIER: Kyle Wise is a 79 y.o., male with a past medical history ofOsteoporosis, low grade B-cell Lymphoma, asthma and dyslipidemia  . He has followed with [facility] since 01/13/2020 for consultative assistance with management of his Hypothyroidism.   HISTORICAL SUMMARY: The patient was first diagnosed with hypothyroidism in 1990 through a holistic doctor, over the years, he was  been on armour thyroid which was discontinued in 11/2019 due to suppressed TSH and switched to LT-4 replacement.    SUBJECTIVE:     Today (04/13/2020):  Kyle Wise is here for for a follow up on hypothyroidism   Weight has been stable Denies constipation except for transient post op No LE edema  Denies depression     He is on Levothyroxine 50 mcg daily    S/P Left TKR 01/2020   HISTORY:  Past Medical History:  Past Medical History:  Diagnosis Date  . Allergy    seasonal  . Anemia   . Anxiety   . Arthritis   . Asthma    no meds now  . Frequent UTI   . History of colon polyps   . Hyperlipidemia   . Hypothyroidism   . Left knee DJD   . Legal blindness   . Low grade B-cell lymphoma ([place]) 2005   low grade b-cell lymphoma  . Osteoporosis    Past Surgical History:  Past Surgical History:  Procedure Laterality Date  . APPENDECTOMY  1999  . BRAIN SURGERY  12/2008   meningioma   . COLON SURGERY  1999   colon resection  . EYE SURGERY     cataract  . KNEE ARTHROSCOPY Left 05/2009  . PARTIAL KNEE ARTHROPLASTY Left 02/03/2020   Procedure: UNICOMPARTMENTAL KNEE;  Surgeon: Marchia Bond, MD;  Location: [place];  Service: Orthopedics;  Laterality: Left;  combo anestesia of adductor canal block and spinal  . PROSTATE ABLATION   2009   "microwave procedure", Dr Hazle Nordmann  . TONSILLECTOMY AND ADENOIDECTOMY  1945   Social History:  reports that he has never smoked. He has never used smokeless tobacco. He reports current alcohol use. He reports that he does not use drugs. Family History:  Family History  Problem Relation Age of Onset  . CAD Mother        MI age 13  . Arthritis Mother   . Heart attack Mother   . Cancer Mother   . Colon cancer Father   . Arthritis Father   . Diabetes Maternal Grandmother   . CAD Maternal Grandfather   . Heart attack Maternal Grandfather   . Asthma Brother   . Arthritis Brother   . Cancer Paternal Grandmother   . Prostate cancer Neg Hx      HOME MEDICATIONS: Allergies as of 04/13/2020   No Known Allergies     Medication List       Accurate as of April 13, 2020 10:39 AM. If you have any questions, ask your nurse or doctor.        levothyroxine 50 MCG tablet Commonly known as: SYNTHROID Take 1 tablet (50 mcg total) by mouth daily.   melatonin 5 MG Tabs Take 5 mg by mouth at bedtime.   multivitamin  with minerals Tabs tablet Take 1 tablet by mouth daily.   tamsulosin 0.4 MG Caps capsule Commonly known as: FLOMAX Take 0.4 mg by mouth 2 (two) times daily.   vitamin C 1000 MG tablet Take 1,000 mg by mouth daily.         OBJECTIVE:   PHYSICAL EXAM: VS: BP 122/66 (BP Location: Left Arm, Cuff Size: Normal)   Pulse 75   Temp 97.7 F (36.5 C) (Oral)   Resp 12   Ht 5\' 8"  (1.727 m)   Wt 158 lb 9.6 oz (71.9 kg)   SpO2 95%   BMI 24.12 kg/m    EXAM: General: Pt appears well and is in NAD  Neck: General: Supple without adenopathy. Thyroid: Thyroid size normal.  No goiter or nodules appreciated. No thyroid bruit.  Lungs: Clear with good BS bilat with no rales, rhonchi, or wheezes  Heart: Auscultation: RRR.  Abdomen: Normoactive bowel sounds, soft, nontender, without masses or organomegaly palpable  Extremities:  BL LE: No pretibial edema normal ROM  and strength.  Mental Status: Judgment, insight: Intact Orientation: Oriented to time, place, and person Mood and affect: No depression, anxiety, or agitation     DATA REVIEWED:  Results for Kyle Wise (MRN [id number]) as of 04/13/2020 16:26  Ref. Range 04/13/2020 11:10  TSH Latest Ref Range: 0.35 - 4.50 uIU/mL 1.83     ASSESSMENT / PLAN / RECOMMENDATIONS:   Hypothyroidism:  - Pt is clinically euthyroid - No local neck symptoms    Medications   Levothyroxine 50 mcg daily    F/U in 1 yr    Signed electronically by: Mack Guise, MD  [facility] End[facility]  [facility] [address] [place], [place] [address] Phone: [phone] FAX: [phone]      CC: Colon Branch, [place] [address] [place] [address] Phone: [phone]  Fax: [phone]   Return to Endocrinology clinic as below: Future Appointments  Date Time Provider [place]  06/25/2020 10:00 AM Colon Branch, MD [facility]

## 2020-04-14 ENCOUNTER — Ambulatory Visit: Payer: Medicare HMO | Admitting: Internal Medicine

## 2020-04-16 DIAGNOSIS — M1712 Unilateral primary osteoarthritis, left knee: Secondary | ICD-10-CM | POA: Diagnosis not present

## 2020-04-21 DIAGNOSIS — R8271 Bacteriuria: Secondary | ICD-10-CM | POA: Diagnosis not present

## 2020-04-21 DIAGNOSIS — R972 Elevated prostate specific antigen [PSA]: Secondary | ICD-10-CM | POA: Diagnosis not present

## 2020-04-21 DIAGNOSIS — N401 Enlarged prostate with lower urinary tract symptoms: Secondary | ICD-10-CM | POA: Diagnosis not present

## 2020-04-21 DIAGNOSIS — R3912 Poor urinary stream: Secondary | ICD-10-CM | POA: Diagnosis not present

## 2020-05-20 ENCOUNTER — Other Ambulatory Visit: Payer: Self-pay | Admitting: Internal Medicine

## 2020-06-25 ENCOUNTER — Other Ambulatory Visit: Payer: Self-pay

## 2020-06-25 ENCOUNTER — Ambulatory Visit (INDEPENDENT_AMBULATORY_CARE_PROVIDER_SITE_OTHER): Payer: Medicare HMO | Admitting: Internal Medicine

## 2020-06-25 VITALS — BP 131/68 | HR 55 | Temp 97.5°F | Ht 68.0 in | Wt 161.0 lb

## 2020-06-25 DIAGNOSIS — E079 Disorder of thyroid, unspecified: Secondary | ICD-10-CM | POA: Diagnosis not present

## 2020-06-25 NOTE — Patient Instructions (Signed)
   GO TO THE FRONT DESK, PLEASE SCHEDULE YOUR APPOINTMENTS Come back for a physical by 11/2020

## 2020-06-25 NOTE — Progress Notes (Signed)
Subjective:    Patient ID: Kyle Wise, male    DOB: 11-Jun-1940, 80 y.o.   MRN: [id number]  DOS:  06/25/2020 Type of visit - description: Follow-up Since the last office visit is doing well. Has gained few pounds.   Wt Readings from Last 3 Encounters:  06/25/20 161 lb (73 kg)  04/13/20 158 lb 9.6 oz (71.9 kg)  03/25/20 157 lb (71.2 kg)     Review of Systems See above   Past Medical History:  Diagnosis Date  . Allergy    seasonal  . Anemia   . Anxiety   . Arthritis   . Asthma    no meds now  . Frequent UTI   . History of colon polyps   . Hyperlipidemia   . Hypothyroidism   . Left knee DJD   . Legal blindness   . Low grade B-cell lymphoma ([place]) 2005   low grade b-cell lymphoma  . Osteoporosis     Past Surgical History:  Procedure Laterality Date  . APPENDECTOMY  1999  . BRAIN SURGERY  12/2008   meningioma   . COLON SURGERY  1999   colon resection  . EYE SURGERY     cataract  . KNEE ARTHROSCOPY Left 05/2009  . PARTIAL KNEE ARTHROPLASTY Left 02/03/2020   Procedure: UNICOMPARTMENTAL KNEE;  Surgeon: Marchia Bond, MD;  Location: [facility];  Service: Orthopedics;  Laterality: Left;  combo anestesia of adductor canal block and spinal  . PROSTATE ABLATION  2009   "microwave procedure", Dr Hazle Nordmann  . TONSILLECTOMY AND ADENOIDECTOMY  1945    Allergies as of 06/25/2020   No Known Allergies     Medication List       Accurate as of June 25, 2020 11:59 PM. If you have any questions, ask your nurse or doctor.        Euthyrox 50 MCG tablet Generic drug: levothyroxine TAKE 1 TABLET EVERY DAY   melatonin 5 MG Tabs Take 5 mg by mouth at bedtime.   multivitamin with minerals Tabs tablet Take 1 tablet by mouth daily.   tamsulosin 0.4 MG Caps capsule Commonly known as: FLOMAX Take 0.4 mg by mouth 2 (two) times daily.   vitamin C 1000 MG tablet Take 1,000 mg by mouth daily.          Objective:   Physical Exam BP 131/68 (BP  Location: Right Arm, Patient Position: Sitting, Cuff Size: Large)   Pulse (!) 55   Temp (!) 97.5 F (36.4 C) (Oral)   Ht 5\' 8"  (1.727 m)   Wt 161 lb (73 kg)   SpO2 98%   BMI 24.48 kg/m  General:   Well developed, NAD, BMI noted. HEENT:  Normocephalic . Face symmetric, atraumatic Lungs:  CTA B Normal respiratory effort, no intercostal retractions, no accessory muscle use. Heart: RRR,  no murmur.  Lower extremities: no pretibial edema bilaterally  Skin: Not pale. Not jaundice Neurologic:  alert & oriented X3.  Speech normal, gait appropriate for age and unassisted Psych--  Cognition and judgment appear intact.  Cooperative with normal attention span and concentration.  Behavior appropriate. No anxious or depressed appearing.      Assessment       ASSESSMENT  Hyperglycemia High cholesterol Thyroid disease: on citomel/armour rx by   Kirk Ruths NP Asthma DJD, history of knee arthroscopy 2011 Low-grade B-cell lymphoma, hematology [facility] GU: Elevated PSA, BPH, LUTS, ureteral stricture --TUMT September 2006 Dr. Burnell Blanks --Recurrent UTIs due to Citrobacter despite  appropriate antibiotics. Not Rx rec for asx bacteriuria --Elevated PSA, range between 3 and 7, prostate biopsy 2005, + basal cell hyperplasia without malignancy -- Prostate MRI 08/2016 with no evidence of high-grade prostate cancer. Dr Alinda Money Legally blind due to meningioma surgery Colon polyp: s/p partial colectomy 1999, Dr Dorrene German Naltroxen: rx by Kirk Ruths NP Used to see Margarita Mail NP (holistic NP)  PLAN: Thyroid disease: Last seen by Endo 04/13/2020, TSH was 1.8, follow-up with them 1 year  Weight loss, diarrhea: WT loss resolve, diarrhea much improved. Update on immunizations RTC CPX 11-2020.  This visit occurred during the SARS-CoV-2 public health emergency.  Safety protocols were in place, including screening questions prior to the visit, additional usage of staff PPE, and extensive cleaning of exam room while  observing appropriate contact time as indicated for disinfecting solutions.

## 2020-06-26 NOTE — Assessment & Plan Note (Signed)
Thyroid disease: Last seen by Endo 04/13/2020, TSH was 1.8, follow-up with them 1 year  Weight loss, diarrhea: WT loss resolve, diarrhea much improved. Update on immunizations RTC CPX 11-2020.

## 2020-07-02 DIAGNOSIS — C8510 Unspecified B-cell lymphoma, unspecified site: Secondary | ICD-10-CM | POA: Diagnosis not present

## 2020-07-02 DIAGNOSIS — Z862 Personal history of diseases of the blood and blood-forming organs and certain disorders involving the immune mechanism: Secondary | ICD-10-CM | POA: Diagnosis not present

## 2020-07-02 DIAGNOSIS — C8514 Unspecified B-cell lymphoma, lymph nodes of axilla and upper limb: Secondary | ICD-10-CM | POA: Diagnosis not present

## 2020-07-02 DIAGNOSIS — E611 Iron deficiency: Secondary | ICD-10-CM | POA: Diagnosis not present

## 2020-07-02 DIAGNOSIS — Z8639 Personal history of other endocrine, nutritional and metabolic disease: Secondary | ICD-10-CM | POA: Diagnosis not present

## 2020-07-02 DIAGNOSIS — R1900 Intra-abdominal and pelvic swelling, mass and lump, unspecified site: Secondary | ICD-10-CM | POA: Diagnosis not present

## 2020-07-21 DIAGNOSIS — M1712 Unilateral primary osteoarthritis, left knee: Secondary | ICD-10-CM | POA: Diagnosis not present

## 2020-10-08 DIAGNOSIS — H47292 Other optic atrophy, left eye: Secondary | ICD-10-CM | POA: Diagnosis not present

## 2020-10-08 DIAGNOSIS — H53021 Refractive amblyopia, right eye: Secondary | ICD-10-CM | POA: Diagnosis not present

## 2020-10-08 DIAGNOSIS — H31092 Other chorioretinal scars, left eye: Secondary | ICD-10-CM | POA: Diagnosis not present

## 2020-10-08 DIAGNOSIS — Z961 Presence of intraocular lens: Secondary | ICD-10-CM | POA: Diagnosis not present

## 2020-12-23 ENCOUNTER — Encounter: Payer: Self-pay | Admitting: Internal Medicine

## 2020-12-23 ENCOUNTER — Other Ambulatory Visit: Payer: Self-pay

## 2020-12-23 ENCOUNTER — Ambulatory Visit (INDEPENDENT_AMBULATORY_CARE_PROVIDER_SITE_OTHER): Payer: Medicare HMO | Admitting: Internal Medicine

## 2020-12-23 VITALS — BP 126/64 | HR 86 | Temp 98.0°F | Resp 16 | Ht 68.0 in | Wt 157.4 lb

## 2020-12-23 DIAGNOSIS — R739 Hyperglycemia, unspecified: Secondary | ICD-10-CM | POA: Diagnosis not present

## 2020-12-23 DIAGNOSIS — E039 Hypothyroidism, unspecified: Secondary | ICD-10-CM

## 2020-12-23 DIAGNOSIS — E7800 Pure hypercholesterolemia, unspecified: Secondary | ICD-10-CM

## 2020-12-23 DIAGNOSIS — Z0000 Encounter for general adult medical examination without abnormal findings: Secondary | ICD-10-CM

## 2020-12-23 LAB — CBC WITH DIFFERENTIAL/PLATELET
Basophils Absolute: 0 10*3/uL (ref 0.0–0.1)
Basophils Relative: 0.3 % (ref 0.0–3.0)
Eosinophils Absolute: 0.1 10*3/uL (ref 0.0–0.7)
Eosinophils Relative: 1.2 % (ref 0.0–5.0)
HCT: 50.9 % (ref 39.0–52.0)
Hemoglobin: 16.5 g/dL (ref 13.0–17.0)
Lymphocytes Relative: 11.1 % — ABNORMAL LOW (ref 12.0–46.0)
Lymphs Abs: 0.6 10*3/uL — ABNORMAL LOW (ref 0.7–4.0)
MCHC: 32.4 g/dL (ref 30.0–36.0)
MCV: 83.8 fl (ref 78.0–100.0)
Monocytes Absolute: 0.5 10*3/uL (ref 0.1–1.0)
Monocytes Relative: 9.6 % (ref 3.0–12.0)
Neutro Abs: 4.2 10*3/uL (ref 1.4–7.7)
Neutrophils Relative %: 77.8 % — ABNORMAL HIGH (ref 43.0–77.0)
Platelets: 141 10*3/uL — ABNORMAL LOW (ref 150.0–400.0)
RBC: 6.08 Mil/uL — ABNORMAL HIGH (ref 4.22–5.81)
RDW: 14.1 % (ref 11.5–15.5)
WBC: 5.4 10*3/uL (ref 4.0–10.5)

## 2020-12-23 LAB — COMPREHENSIVE METABOLIC PANEL
ALT: 12 U/L (ref 0–53)
AST: 16 U/L (ref 0–37)
Albumin: 4.3 g/dL (ref 3.5–5.2)
Alkaline Phosphatase: 61 U/L (ref 39–117)
BUN: 18 mg/dL (ref 6–23)
CO2: 26 mEq/L (ref 19–32)
Calcium: 9.3 mg/dL (ref 8.4–10.5)
Chloride: 102 mEq/L (ref 96–112)
Creatinine, Ser: 1.11 mg/dL (ref 0.40–1.50)
GFR: 62.18 mL/min (ref >60.00)
Glucose, Bld: 166 mg/dL — ABNORMAL HIGH (ref 70–99)
Potassium: 4.2 mEq/L (ref 3.5–5.1)
Sodium: 139 mEq/L (ref 135–145)
Total Bilirubin: 0.9 mg/dL (ref 0.2–1.2)
Total Protein: 6.6 g/dL (ref 6.0–8.3)

## 2020-12-23 LAB — LIPID PANEL
Cholesterol: 178 mg/dL (ref 0–200)
HDL: 43.1 mg/dL (ref >39.00)
LDL Cholesterol: 114 mg/dL — ABNORMAL HIGH (ref 0–99)
NonHDL: 134.73
Total CHOL/HDL Ratio: 4
Triglycerides: 102 mg/dL (ref 0.0–149.0)
VLDL: 20.4 mg/dL (ref 0.0–40.0)

## 2020-12-23 LAB — TSH: TSH: 1.48 u[IU]/mL (ref 0.35–5.50)

## 2020-12-23 LAB — HEMOGLOBIN A1C: Hgb A1c MFr Bld: 5.6 % (ref 4.6–6.5)

## 2020-12-23 NOTE — Patient Instructions (Signed)
   GO TO THE LAB : Get the blood work     GO TO THE FRONT DESK, PLEASE SCHEDULE YOUR APPOINTMENTS Come back for a physical exam in 1 year 

## 2020-12-23 NOTE — Progress Notes (Signed)
Subjective:    Patient ID: Kyle Wise, male    DOB: November 15, 1940, 80 y.o.   MRN: [id number]  DOS:  12/23/2020 Type of visit - description: CPX  Since the last office visit he is doing well and has no concerns  Review of Systems No new symptoms, history of DJD.  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   low grade b-cell lymphoma   Osteoporosis     Past Surgical History:  Procedure Laterality Date   APPENDECTOMY  1999   BRAIN SURGERY  12/2008   meningioma    COLON SURGERY  1999   colon resection   EYE SURGERY     cataract   KNEE ARTHROSCOPY Left 05/2009   PARTIAL KNEE ARTHROPLASTY Left 02/03/2020   Procedure: UNICOMPARTMENTAL KNEE;  Surgeon: Marchia Bond, MD;  Location: [facility];  Service: Orthopedics;  Laterality: Left;  combo anestesia of adductor canal block and spinal   PROSTATE ABLATION  2009   "microwave procedure", Dr Hazle Nordmann   TONSILLECTOMY AND ADENOIDECTOMY  1945   Social History   Socioeconomic History   Marital status: Married    Spouse name: Not on file   Number of children: 2   Years of education: Not on file   Highest education level: Not on file  Occupational History   Occupation: retiredProgramme researcher, broadcasting/film/video for [employer]  Tobacco Use   Smoking status: Never   Smokeless tobacco: Never  Substance and Sexual Activity   Alcohol use: Yes    Comment: socially   Drug use: Never   Sexual activity: Not on file  Other Topics Concern   Not on file  Social History Narrative   Household: Pt and wife   Social Determinants of Health   Financial Resource Strain: Not on file  Food Insecurity: Not on file  Transportation Needs: Not on file   Physical Activity: Not on file  Stress: Not on file  Social Connections: Not on file  Intimate Partner Violence: Not on file    Allergies as of 12/23/2020   No Known Allergies      Medication List        Accurate as of December 23, 2020 11:59 PM. If you have any questions, ask your nurse or doctor.          Euthyrox 50 MCG tablet Generic drug: levothyroxine TAKE 1 TABLET EVERY DAY   melatonin 5 MG Tabs Take 5 mg by mouth at bedtime.   multivitamin with minerals Tabs tablet Take 1 tablet by mouth daily.   tamsulosin 0.4 MG Caps capsule Commonly known as: FLOMAX Take 0.4 mg by mouth 2 (two) times daily.   vitamin C 1000 MG tablet Take 1,000 mg by mouth daily.           Objective:   Physical Exam BP 126/64 (BP Location: Left Arm, Patient Position: Sitting, Cuff Size: Small)   Pulse 86   Temp 98 F (36.7 C) (Oral)   Resp 16   Ht '5\' 8"'$  (1.727 m)  Wt 157 lb 6 oz (71.4 kg)   SpO2 96%   BMI 23.93 kg/m  General: Well developed, NAD, BMI noted Neck: No  thyromegaly  HEENT:  Normocephalic . Face symmetric, atraumatic Lungs:  CTA B Normal respiratory effort, no intercostal retractions, no accessory muscle use. Heart: RRR,  no murmur.  Abdomen:  Not distended, soft, non-tender. No rebound or rigidity.   Lower extremities: no pretibial edema bilaterally  Skin: Exposed areas without rash. Not pale. Not jaundice Neurologic:  alert & oriented X3.  Speech normal, gait appropriate for age and unassisted.  Transfer somewhat limited by knee pain Strength symmetric and appropriate for age.  Psych: Cognition and judgment appear intact.  Cooperative with normal attention span and concentration.  Behavior appropriate. No anxious or depressed appearing.     Assessment    ASSESSMENT  Hyperglycemia High cholesterol Thyroid disease (Used to see Briles NP, h/o iatrogenic hyperthyroidism) Asthma DJD, history of knee arthroscopy 2011 Low-grade B-cell lymphoma,  hematology [facility] GU: Elevated PSA, BPH, LUTS, ureteral stricture --TUMT September 2006 Dr. Burnell Blanks --Recurrent UTIs due to Citrobacter despite appropriate antibiotics. Not Rx rec for asx bacteriuria --Elevated PSA, range between 3 and 7, prostate biopsy 2005, + basal cell hyperplasia without malignancy -- Prostate MRI 08/2016 with no evidence of high-grade prostate cancer. Dr Alinda Money Legally blind due to meningioma surgery Colon polyp: s/p partial colectomy 1999, Dr Dorrene German H/o skin cancer , sees derm  PLAN:  Here for CPX Hyperglycemia: Checking labs Hyperlipidemia: Diet controlled, checking labs Thyroid disease: Good compliance with meds, check TSH Low-grade B cell lymphoma: Last visit with hematology 06-2020, stable. Iron deficiency: Ferritin was low when he saw hematology, on iron supplements. Elevated PSA: Sees urology regularly, will get records. History of skin cancers: To see dermatology 01/24/2021 per patient RTC 1 year   This visit occurred during the SARS-CoV-2 public health emergency.  Safety protocols were in place, including screening questions prior to the visit, additional usage of staff PPE, and extensive cleaning of exam room while observing appropriate contact time as indicated for disinfecting solutions.

## 2020-12-24 ENCOUNTER — Encounter: Payer: Self-pay | Admitting: Internal Medicine

## 2020-12-24 NOTE — Assessment & Plan Note (Signed)
Td 2020 PNM 23: 2020; PNM 13: 2018 S/p shingrix S/p covid vax x4 Flu shot  rec q year [facility]: Colonoscopy 09/01/2015: + Polyps. Saw GI 08/18/2019, Cscope 08/22/2019: Normal.  No evidence of AVMs, mass, colitis.  + Small hemorrhoids. Prostate Ca screening : sees urology Labs: CMP, FLP, A1c, TSH, CBC.  POA: On file

## 2020-12-24 NOTE — Assessment & Plan Note (Signed)
Here for CPX Hyperglycemia: Checking labs Hyperlipidemia: Diet controlled, checking labs Thyroid disease: Good compliance with meds, check TSH Low-grade B cell lymphoma: Last visit with hematology 06-2020, stable. Iron deficiency: Ferritin was low when he saw hematology, on iron supplements. Elevated PSA: Sees urology regularly, will get records. History of skin cancers: To see dermatology 01/24/2021 per patient RTC 1 year

## 2021-01-24 DIAGNOSIS — D485 Neoplasm of uncertain behavior of skin: Secondary | ICD-10-CM | POA: Diagnosis not present

## 2021-01-24 DIAGNOSIS — C4401 Basal cell carcinoma of skin of lip: Secondary | ICD-10-CM | POA: Diagnosis not present

## 2021-02-02 DIAGNOSIS — M1712 Unilateral primary osteoarthritis, left knee: Secondary | ICD-10-CM | POA: Diagnosis not present

## 2021-02-18 DIAGNOSIS — E569 Vitamin deficiency, unspecified: Secondary | ICD-10-CM | POA: Diagnosis not present

## 2021-02-18 DIAGNOSIS — E611 Iron deficiency: Secondary | ICD-10-CM | POA: Diagnosis not present

## 2021-02-18 DIAGNOSIS — R7303 Prediabetes: Secondary | ICD-10-CM | POA: Diagnosis not present

## 2021-02-18 DIAGNOSIS — D510 Vitamin B12 deficiency anemia due to intrinsic factor deficiency: Secondary | ICD-10-CM | POA: Diagnosis not present

## 2021-02-18 DIAGNOSIS — E039 Hypothyroidism, unspecified: Secondary | ICD-10-CM | POA: Diagnosis not present

## 2021-02-18 DIAGNOSIS — E785 Hyperlipidemia, unspecified: Secondary | ICD-10-CM | POA: Diagnosis not present

## 2021-02-18 DIAGNOSIS — E559 Vitamin D deficiency, unspecified: Secondary | ICD-10-CM | POA: Diagnosis not present

## 2021-02-18 DIAGNOSIS — D529 Folate deficiency anemia, unspecified: Secondary | ICD-10-CM | POA: Diagnosis not present

## 2021-02-18 DIAGNOSIS — R972 Elevated prostate specific antigen [PSA]: Secondary | ICD-10-CM | POA: Diagnosis not present

## 2021-02-18 DIAGNOSIS — E790 Hyperuricemia without signs of inflammatory arthritis and tophaceous disease: Secondary | ICD-10-CM | POA: Diagnosis not present

## 2021-02-18 LAB — CBC AND DIFFERENTIAL
HCT: 54 — AB (ref 41–53)
Hemoglobin: 17.6 — AB (ref 13.5–17.5)
Neutrophils Absolute: 4.2
Platelets: 146 10*3/uL — AB (ref 150–400)
WBC: 5.8

## 2021-02-18 LAB — IRON,TIBC AND FERRITIN PANEL
%SAT: 28
Ferritin: 66
Iron: 97
TIBC: 348
UIBC: 251

## 2021-02-18 LAB — HEPATIC FUNCTION PANEL
ALT: 17 U/L (ref 10–40)
AST: 21 (ref 14–40)
Alkaline Phosphatase: 70 (ref 25–125)
Bilirubin, Total: 0.7

## 2021-02-18 LAB — LIPID PANEL
Cholesterol: 192 (ref 0–200)
HDL: 43 (ref 35–70)
LDL Cholesterol: 133
Triglycerides: 87 (ref 40–160)

## 2021-02-18 LAB — BASIC METABOLIC PANEL
BUN: 20 (ref 4–21)
CO2: 23 — AB (ref 13–22)
Chloride: 102 (ref 99–108)
Creatinine: 1.3 (ref 0.6–1.3)
Glucose: 92
Potassium: 4.1 mEq/L (ref 3.5–5.1)
Sodium: 145 (ref 137–147)

## 2021-02-18 LAB — PSA: PSA: 4.7

## 2021-02-18 LAB — VITAMIN D 25 HYDROXY (VIT D DEFICIENCY, FRACTURES): Vit D, 25-Hydroxy: 63.6

## 2021-02-18 LAB — HEMOGLOBIN A1C: Hemoglobin A1C: 5.8

## 2021-02-18 LAB — COMPREHENSIVE METABOLIC PANEL
Albumin: 4.6 (ref 3.5–5.0)
Calcium: 9.4 (ref 8.7–10.7)
Globulin: 2
eGFR: 56

## 2021-02-18 LAB — VITAMIN B12: Vitamin B-12: 619

## 2021-02-18 LAB — TSH: TSH: 2.75 (ref 0.41–5.90)

## 2021-02-18 LAB — TESTOSTERONE: Testosterone: 685

## 2021-02-18 LAB — CBC: RBC: 6.55 — AB (ref 3.87–5.11)

## 2021-03-13 ENCOUNTER — Other Ambulatory Visit: Payer: Self-pay | Admitting: Internal Medicine

## 2021-03-14 DIAGNOSIS — Z862 Personal history of diseases of the blood and blood-forming organs and certain disorders involving the immune mechanism: Secondary | ICD-10-CM | POA: Diagnosis not present

## 2021-03-14 DIAGNOSIS — C8510 Unspecified B-cell lymphoma, unspecified site: Secondary | ICD-10-CM | POA: Diagnosis not present

## 2021-03-14 DIAGNOSIS — C8304 Small cell B-cell lymphoma, lymph nodes of axilla and upper limb: Secondary | ICD-10-CM | POA: Diagnosis not present

## 2021-04-04 ENCOUNTER — Ambulatory Visit (INDEPENDENT_AMBULATORY_CARE_PROVIDER_SITE_OTHER): Payer: Medicare HMO

## 2021-04-04 VITALS — Ht 68.0 in | Wt 157.0 lb

## 2021-04-04 DIAGNOSIS — Z0000 Encounter for general adult medical examination without abnormal findings: Secondary | ICD-10-CM

## 2021-04-04 NOTE — Patient Instructions (Signed)
Kyle Wise , Thank you for taking time to complete  your Medicare Wellness Visit. I appreciate your ongoing commitment to your health goals. Please review the following plan we discussed and let me know if I can assist you in the future.   Screening recommendations/referrals: Colonoscopy: Completed 08/22/2019 Recommended yearly ophthalmology/optometry visit for glaucoma screening and checkup Recommended yearly dental visit for hygiene and checkup  Vaccinations: Influenza vaccine: Up to date Pneumococcal vaccine: Up to date Tdap vaccine: Up to date-Due-06/17/2028 Shingles vaccine: Completed vaccines   Covid-19: Up to date  Advanced directives: Copy in chart  Conditions/risks identified: See problem list  Next appointment: Follow up in one year for your annual wellness visit. 04/06/2022 @ 1:40  Preventive Care 82 Years and Older, Male Preventive care refers to lifestyle choices and visits with your health care provider that can promote health and wellness. What does preventive care include? A yearly physical exam. This is also called an annual well check. Dental exams once or twice a year. Routine eye exams. Ask your health care provider how often you should have your eyes checked. Personal lifestyle choices, including: Daily care of your teeth and gums. Regular physical activity. Eating a healthy diet. Avoiding tobacco and drug use. Limiting alcohol use. Practicing safe sex. Taking low doses of aspirin every day. Taking vitamin and mineral supplements as recommended by your health care provider. What happens during an annual well check? The services and screenings done by your health care provider during your annual well check will depend on your age, overall health, lifestyle risk factors, and family history of disease. Counseling  Your health care provider may ask you questions about your: Alcohol use. Tobacco use. Drug use. Emotional well-being. Home and relationship  well-being. Sexual activity. Eating habits. History of falls. Memory and ability to understand (cognition). Work and work Statistician. Screening  You may have the following tests or measurements: Height, weight, and BMI. Blood pressure. Lipid and cholesterol levels. These may be checked every 5 years, or more frequently if you are over 47 years old. Skin check. Lung cancer screening. You may have this screening every year starting at age 31 if you have a 30-pack-year history of smoking and currently smoke or have quit within the past 15 years. Fecal occult blood test (FOBT) of the stool. You may have this test every year starting at age 72. Flexible sigmoidoscopy or colonoscopy. You may have a sigmoidoscopy every 5 years or a colonoscopy every 10 years starting at age 38. Prostate cancer screening. Recommendations will vary depending on your family history and other risks. Hepatitis C blood test. Hepatitis B blood test. Sexually transmitted disease (STD) testing. Diabetes screening. This is done by checking your blood sugar (glucose) after you have not eaten for a while (fasting). You may have this done every 1-3 years. Abdominal aortic aneurysm (AAA) screening. You may need this if you are a current or former smoker. Osteoporosis. You may be screened starting at age 16 if you are at high risk. Talk with your health care provider about your test results, treatment options, and if necessary, the need for more tests. Vaccines  Your health care provider may recommend certain vaccines, such as: Influenza vaccine. This is recommended every year. Tetanus, diphtheria, and acellular pertussis (Tdap, Td) vaccine. You may need a Td booster every 10 years. Zoster vaccine. You may need this after age 35. Pneumococcal 13-valent conjugate (PCV13) vaccine. One dose is recommended after age 10. Pneumococcal polysaccharide (PPSV23) vaccine. One dose is  recommended after age 60. Talk to your health care  provider about which screenings and vaccines you need and how often you need them. This information is not intended to replace advice given to you by your health care provider. Make sure you discuss any questions you have with your health care provider. Document Released: 06/11/2015 Document Revised: 02/02/2016 Document Reviewed: 03/16/2015 Elsevier Interactive Patient Education  2017 [place] Prevention in the Home Falls can cause injuries. They can happen to people of all ages. There are many things you can do to make your home safe and to help prevent falls. What can I do on the outside of my home? Regularly fix the edges of walkways and driveways and fix any cracks. Remove anything that might make you trip as you walk through a door, such as a raised step or threshold. Trim any bushes or trees on the path to your home. Use bright outdoor lighting. Clear any walking paths of anything that might make someone trip, such as rocks or tools. Regularly check to see if handrails are loose or broken. Make sure that both sides of any steps have handrails. Any raised decks and porches should have guardrails on the edges. Have any leaves, snow, or ice cleared regularly. Use sand or salt on walking paths during winter. Clean up any spills in your garage right away. This includes oil or grease spills. What can I do in the bathroom? Use night lights. Install grab bars by the toilet and in the tub and shower. Do not use towel bars as grab bars. Use non-skid mats or decals in the tub or shower. If you need to sit down in the shower, use a plastic, non-slip stool. Keep the floor dry. Clean up any water that spills on the floor as soon as it happens. Remove soap buildup in the tub or shower regularly. Attach bath mats securely with double-sided non-slip rug tape. Do not have throw rugs and other things on the floor that can make you trip. What can I do in the bedroom? Use night lights. Make  sure that you have a light by your bed that is easy to reach. Do not use any sheets or blankets that are too big for your bed. They should not hang down onto the floor. Have a firm chair that has side arms. You can use this for support while you get dressed. Do not have throw rugs and other things on the floor that can make you trip. What can I do in the kitchen? Clean up any spills right away. Avoid walking on wet floors. Keep items that you use a lot in easy-to-reach places. If you need to reach something above you, use a strong step stool that has a grab bar. Keep electrical cords out of the way. Do not use floor polish or wax that makes floors slippery. If you must use wax, use non-skid floor wax. Do not have throw rugs and other things on the floor that can make you trip. What can I do with my stairs? Do not leave any items on the stairs. Make sure that there are handrails on both sides of the stairs and use them. Fix handrails that are broken or loose. Make sure that handrails are as long as the stairways. Check any carpeting to make sure that it is firmly attached to the stairs. Fix any carpet that is loose or worn. Avoid having throw rugs at the top or bottom of the stairs. If  you do have throw rugs, attach them to the floor with carpet tape. Make sure that you have a light switch at the top of the stairs and the bottom of the stairs. If you do not have them, ask someone to add them for you. What else can I do to help prevent falls? Wear shoes that: Do not have high heels. Have rubber bottoms. Are comfortable and fit you well. Are closed at the toe. Do not wear sandals. If you use a stepladder: Make sure that it is fully opened. Do not climb a closed stepladder. Make sure that both sides of the stepladder are locked into place. Ask someone to hold it for you, if possible. Clearly mark and make sure that you can see: Any grab bars or handrails. First and last steps. Where the  edge of each step is. Use tools that help you move around (mobility aids) if they are needed. These include: Canes. Walkers. Scooters. Crutches. Turn on the lights when you go into a dark area. Replace any light bulbs as soon as they burn out. Set up your furniture so you have a clear path. Avoid moving your furniture around. If any of your floors are uneven, fix them. If there are any pets around you, be aware of where they are. Review your medicines with your doctor. Some medicines can make you feel dizzy. This can increase your chance of falling. Ask your doctor what other things that you can do to help prevent falls. This information is not intended to replace advice given to you by your health care provider. Make sure you discuss any questions you have with your health care provider. Document Released: 03/11/2009 Document Revised: 10/21/2015 Document Reviewed: 06/19/2014 Elsevier Interactive Patient Education  2017 [employer].

## 2021-04-04 NOTE — Progress Notes (Addendum)
Subjective:   Kyle Wise is a 80 y.o. male who presents for an Initial Medicare Annual Wellness Visit.  I connected with Rontavious today by telephone and verified that I am speaking with the correct person using two identifiers. Location patient: home Location provider: work Persons participating in the virtual visit: patient, Marine scientist.    I discussed the limitations, risks, security and privacy concerns of performing an evaluation and management service by telephone and the availability of in person appointments. I also discussed with the patient that there may be a patient responsible charge related to this service. The patient expressed understanding and verbally consented to this telephonic visit.    Interactive audio and video telecommunications were attempted between this provider and patient, however failed, due to patient having technical difficulties OR patient did not have access to video capability.  We continued and completed visit with audio only.  Some vital signs may be absent or patient reported.   Time Spent with patient on telephone encounter: 20 minutes   Review of Systems     Cardiac Risk Factors include: advanced age (>89men, >86 women);male gender;dyslipidemia     Objective:    Today's Vitals   04/04/21 1142  Weight: 157 lb (71.2 kg)  Height: 5\' 8"  (1.727 m)   Body mass index is 23.87 kg/m.  Advanced Directives 04/04/2021 02/03/2020 01/26/2020  Does Patient Have a Medical Advance Directive? Yes Yes Yes  Type of Paramedic of [place];Living will [place];Living will [place];Living will  Does patient want to make changes to medical advance directive? - No - Patient declined No - Patient declined  Copy of [place] in Chart? Yes - validated most recent copy scanned in chart (See row information) No - copy requested No - copy requested    Current Medications (verified) Outpatient  Encounter Medications as of 04/04/2021  Medication Sig   Ascorbic Acid (VITAMIN C) 1000 MG tablet Take 1,000 mg by mouth daily.   levothyroxine (SYNTHROID) 50 MCG tablet Take 1 tablet by mouth daily.   melatonin 5 MG TABS Take 5 mg by mouth at bedtime.   Multiple Vitamin (MULTIVITAMIN WITH MINERALS) TABS tablet Take 1 tablet by mouth daily.   tamsulosin (FLOMAX) 0.4 MG CAPS capsule Take 0.4 mg by mouth 2 (two) times daily.   [DISCONTINUED] EUTHYROX 50 MCG tablet TAKE 1 TABLET EVERY DAY   No facility-administered encounter medications on file as of 04/04/2021.    Allergies (verified) Patient has no known allergies.   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   low grade b-cell lymphoma   Osteoporosis    Past Surgical History:  Procedure Laterality Date   APPENDECTOMY  1999   BRAIN SURGERY  12/2008   meningioma    COLON SURGERY  1999   colon resection   EYE SURGERY     cataract   KNEE ARTHROSCOPY Left 05/2009   PARTIAL KNEE ARTHROPLASTY Left 02/03/2020   Procedure: UNICOMPARTMENTAL KNEE;  Surgeon: Marchia Bond, MD;  Location: [facility];  Service: Orthopedics;  Laterality: Left;  combo anestesia of adductor canal block and spinal   PROSTATE ABLATION  2009   "microwave procedure", Dr Hazle Nordmann   TONSILLECTOMY AND ADENOIDECTOMY  1945   Family History  Problem Relation Age of Onset   CAD Mother        MI age 32   Arthritis Mother    Heart attack Mother    Cancer Mother    Colon cancer Father    Arthritis Father    Diabetes Maternal Grandmother    CAD Maternal Grandfather    Heart attack Maternal Grandfather    Asthma Brother    Arthritis Brother    Cancer Paternal Grandmother    Prostate cancer Neg Hx     Social History   Socioeconomic History   Marital status: Married    Spouse name: Not on file   Number of children: 2   Years of education: Not on file   Highest education level: Not on file  Occupational History   Occupation: retiredProgramme researcher, broadcasting/film/video for [employer]  Tobacco Use   Smoking status: Never   Smokeless tobacco: Never  Substance and Sexual Activity   Alcohol use: Yes    Comment: socially   Drug use: Never   Sexual activity: Not on file  Other Topics Concern   Not on file  Social History Narrative   Household: Pt and wife   Social Determinants of Health   Financial Resource Strain: Low Risk    Difficulty of Paying Living Expenses: Not hard at all  Food Insecurity: No Food Insecurity   Worried About Charity fundraiser in the Last Year: Never true   Arboriculturist in the Last Year: Never true  Transportation Needs: No Transportation Needs   Lack of Transportation (Medical): No   Lack of Transportation (Non-Medical): No  Physical Activity: Sufficiently Active   Days of Exercise per Week: 5 days   Minutes of Exercise per Session: 60 min  Stress: No Stress Concern Present   Feeling of Stress : Not at all  Social Connections: Moderately Integrated   Frequency of Communication with Friends and Family: More than three times a week   Frequency of Social Gatherings with Friends and Family: More than three times a week   Attends Religious Services: Never   Marine scientist or Organizations: Yes   Attends Music therapist: More than 4 times per year   Marital Status: Married    Tobacco Counseling Counseling given: Not Answered   Clinical Intake:  Pre-visit preparation completed: Yes  Pain : No/denies pain     BMI - recorded: 23.87 Nutritional Status: BMI of 19-24  Normal Nutritional Risks: None Diabetes: No  How often do you need to have someone help you when you read instructions, pamphlets, or other written materials from your doctor or  pharmacy?: 1 - Never  Diabetic?No  Interpreter Needed?: No  Information entered by :: Caroleen Hamman LPN   Activities of Daily Living In your present state of health, do you have any difficulty performing the following activities: 04/04/2021 12/23/2020  Hearing? Y N  Comment hearing aids -  Vision? Tempie Donning  Comment legally blind -  Difficulty concentrating or making decisions? N N  Walking or climbing stairs? N N  Dressing or bathing? N N  Doing errands, shopping? Y Y  Comment unable to drive due to vision -  Preparing Food and eating ? N -  Using the Toilet? N -  In the past six months,  have you accidently leaked urine? N -  Do you have problems with loss of bowel control? N -  Managing your Medications? N -  Managing your Finances? N -  Housekeeping or managing your Housekeeping? N -  Some recent data might be hidden    Patient Care Team: Colon Branch, MD as PCP - General (Internal Medicine) Rhoton, Shelda Altes., MD as Consulting Physician (Gastroenterology) Dasher, Jeneen Rinks, MD as Consulting Physician (Surgery) Zebedee Iba., MD as Referring Physician (Hematology and Oncology) Earlie Server, MD as Consulting Physician (Orthopedic Surgery) Raynelle Bring, MD as Consulting Physician (Urology) Luberta Mutter, MD as Consulting Physician (Ophthalmology) Arlis Porta, MD as Consulting Physician (Dermatology) Bryson Corona, NP as Nurse Practitioner (Family Medicine)  Indicate any recent Medical Services you may have received from other than [facility] providers in the past year (date may be approximate).     Assessment:   This is a routine wellness examination for [place].  Hearing/Vision screen Hearing Screening - Comments:: Bilateral hearing aids Vision Screening - Comments:: Legally blind-Last eye exam-within the last year-[facility]  Dietary issues and exercise activities discussed: Current Exercise Habits: Home exercise routine, Type of exercise: strength  training/weights;Other - see comments (cardio), Time (Minutes): 60, Frequency (Times/Week): 5, Weekly Exercise (Minutes/Week): 300, Intensity: Mild, Exercise limited by: None identified   Goals Addressed             This Visit's Progress    Patient Stated       Maintain current health & activity level       Depression Screen PHQ 2/9 Scores 04/04/2021 12/23/2020 06/25/2020 08/19/2019 12/20/2018 12/14/2017  PHQ - 2 Score 0 0 0 0 0 0    Fall Risk Fall Risk  04/04/2021 12/23/2020 06/25/2020 12/24/2019 08/19/2019  Falls in the past year? 0 0 0 0 0  Number falls in past yr: 0 0 0 0 0  Injury with Fall? 0 0 0 0 0  Risk for fall due to : Impaired vision - - - -  Follow up Falls prevention discussed Falls evaluation completed - Falls evaluation completed Falls evaluation completed    FALL RISK PREVENTION PERTAINING TO THE HOME:  Any stairs in or around the home? Yes  If so, are there any without handrails? No  Home free of loose throw rugs in walkways, pet beds, electrical cords, etc? Yes  Adequate lighting in your home to reduce risk of falls? Yes   ASSISTIVE DEVICES UTILIZED TO PREVENT FALLS:  Life alert? No  Use of a cane, walker or w/c? No  Grab bars in the bathroom? No  Shower chair or bench in shower? No  Elevated toilet seat or a handicapped toilet? No   TIMED UP AND GO:  Was the test performed? No . Phone visit   Cognitive Function:Normal cognitive status assessed by this Nurse Health Advisor. No abnormalities found.          Immunizations Immunization History  Administered Date(s) Administered   H1N1 05/14/2008   Influenza Split 02/27/2020   Influenza, High Dose Seasonal PF 04/03/2017   Influenza, Seasonal, Injecte, Preservative Fre 01/25/2019   Influenza,inj,Quad PF,6+ Mos 05/31/2016   Influenza-Unspecified 04/27/2015, 03/08/2018, 03/26/2021   PFIZER(Purple Top)SARS-COV-2 Vaccination 07/03/2019, 07/29/2019, 03/22/2020, 10/13/2020   Pfizer Covid-19 Vaccine  Bivalent Booster 5y-11y 03/26/2021   Pneumococcal Conjugate-13 05/31/2016   Pneumococcal Polysaccharide-23 06/17/2018   Td 06/17/2018   Zoster Recombinat (Shingrix) 01/25/2019, 03/28/2019    TDAP status: Up to date  Flu Vaccine status: Up  to date  Pneumococcal vaccine status: Up to date  Covid-19 vaccine status: Completed vaccines  Qualifies for Shingles Vaccine? No   Zostavax completed No   Shingrix Completed?: Yes  Screening Tests Health Maintenance  Topic Date Due   COLONOSCOPY (Pts 45-82yrs Insurance coverage will need to be confirmed)  08/21/2024   TETANUS/TDAP  06/17/2028   Pneumonia Vaccine 61+ Years old  Completed   INFLUENZA VACCINE  Completed   COVID-19 Vaccine  Completed   Zoster Vaccines- Shingrix  Completed   HPV VACCINES  Aged Out    Health Maintenance  There are no preventive care reminders to display for this patient.   Colorectal cancer screening: No longer required.   Lung Cancer Screening: (Low Dose CT Chest recommended if Age 32-80 years, 30 pack-year currently smoking OR have quit w/in 15years.) does not qualify.    Additional Screening:  Hepatitis C Screening: does not qualify  Vision Screening: Recommended annual ophthalmology exams for early detection of glaucoma and other disorders of the eye. Is the patient up to date with their annual eye exam?  Yes  Who is the provider or what is the name of the office in which the patient attends annual eye exams? [place] Screening: Recommended annual dental exams for proper oral hygiene  Community Resource Referral / Chronic Care Management: CRR required this visit?  No   CCM required this visit?  No      Plan:     I have personally reviewed and noted the following in the patient's chart:   Medical and social history Use of alcohol, tobacco or illicit drugs  Current medications and supplements including opioid prescriptions. Patient is not currently taking opioid  prescriptions. Functional ability and status Nutritional status Physical activity Advanced directives List of other physicians Hospitalizations, surgeries, and ER visits in previous 12 months Vitals Screenings to include cognitive, depression, and falls Referrals and appointments  In addition, I have reviewed and discussed with patient certain preventive protocols, quality metrics, and best practice recommendations. A written personalized care plan for preventive services as well as general preventive health recommendations were provided to patient.   Due to this being a telephonic visit, the after visit summary with patients personalized plan was offered to patient via mail or my-chart. Per request, patient was mailed a copy of Lorenz Park, LPN   45/07/6466  Nurse Health Advisor  Nurse Notes: None   I have reviewed and agree with Health Coaches documentation.  Kathlene November, MD

## 2021-04-13 DIAGNOSIS — C4401 Basal cell carcinoma of skin of lip: Secondary | ICD-10-CM | POA: Diagnosis not present

## 2021-04-19 ENCOUNTER — Other Ambulatory Visit: Payer: Self-pay

## 2021-04-19 ENCOUNTER — Ambulatory Visit: Payer: Medicare HMO | Admitting: Internal Medicine

## 2021-04-19 ENCOUNTER — Encounter: Payer: Self-pay | Admitting: Internal Medicine

## 2021-04-19 VITALS — BP 130/70 | HR 68 | Ht 68.0 in | Wt 158.0 lb

## 2021-04-19 DIAGNOSIS — E039 Hypothyroidism, unspecified: Secondary | ICD-10-CM

## 2021-04-19 LAB — TSH: TSH: 2.16 u[IU]/mL (ref 0.35–5.50)

## 2021-04-19 MED ORDER — LEVOTHYROXINE SODIUM 50 MCG PO TABS: 50.0000 ug | ORAL_TABLET | Freq: Every day | ORAL | 3 refills | Status: DC

## 2021-04-19 NOTE — Patient Instructions (Signed)
-   Continue Levothyroxine 50 mcg daily

## 2021-04-19 NOTE — Progress Notes (Signed)
Name: Kyle Wise  MRN/ DOB: [id number], February 17, 1941    Age/ Sex: 80 y.o., male     PCP: Colon Branch, MD   Reason for Endocrinology Evaluation: Hypothyroidism     Initial Endocrinology Clinic Visit: 01/13/2020    PATIENT IDENTIFIER: Kyle Wise is a 80 y.o., male with a past medical history ofOsteoporosis, low grade B-cell Lymphoma, asthma and dyslipidemia  . He has followed with [facility] since 01/13/2020 for consultative assistance with management of his Hypothyroidism.   HISTORICAL SUMMARY: The patient was first diagnosed with hypothyroidism in 1990 through a holistic doctor, over the years, he was  been on armour thyroid which was discontinued in 11/2019 due to suppressed TSH and switched to LT-4 replacement.      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   low grade b-cell lymphoma   Osteoporosis    Past Surgical History:  Past Surgical History:  Procedure Laterality Date   [place] SURGERY  12/2008   meningioma    COLON SURGERY  1999   colon resection   EYE SURGERY     cataract   KNEE ARTHROSCOPY Left 05/2009   PARTIAL KNEE ARTHROPLASTY Left 02/03/2020   Procedure: UNICOMPARTMENTAL KNEE;  Surgeon: Marchia Bond, MD;  Location: [facility];  Service: Orthopedics;  Laterality: Left;  combo anestesia of adductor canal  block and spinal   PROSTATE ABLATION  2009   "microwave procedure", Dr Hazle Nordmann   TONSILLECTOMY AND ADENOIDECTOMY  1945   Social History:  reports that he has never smoked. He has never used smokeless tobacco. He reports current alcohol use. He reports that he does not use drugs. Family History:  Family History  Problem Relation Age of Onset   CAD Mother        MI age 79   Arthritis Mother    Heart attack Mother    Cancer Mother    Colon cancer Father    Arthritis Father    Diabetes Maternal Grandmother    CAD Maternal Grandfather    Heart attack Maternal Grandfather    Asthma Brother    Arthritis Brother    Cancer Paternal Grandmother    Prostate cancer Neg Hx      HOME MEDICATIONS: Allergies as of 04/19/2021   No Known Allergies      Medication List        Accurate as of April 19, 2021  4:16 PM. If you have any questions, ask your nurse or doctor.          STOP taking these medications    vitamin C 1000 MG tablet Stopped by:  Dorita Sciara, MD       TAKE these medications    levothyroxine 50 MCG tablet Commonly known as: SYNTHROID Take 1 tablet by mouth daily.   melatonin 5 MG Tabs Take 5 mg by mouth at bedtime.   multivitamin with minerals Tabs tablet Take 1 tablet by mouth daily.   tamsulosin 0.4 MG Caps capsule Commonly known as: FLOMAX Take 0.4 mg by mouth 2 (two) times daily.          OBJECTIVE:   PHYSICAL EXAM: VS: BP 130/70 (BP Location: Left Arm, Patient Position: Sitting, Cuff Size: Small)   Pulse 68   Ht 5\' 8"  (1.727 m)   Wt 158 lb (71.7 kg)   SpO2 97%   BMI 24.02 kg/m    EXAM: General: Pt appears well and is in NAD  Neck: General: Supple without adenopathy. Thyroid: Thyroid size normal.  No goiter or nodules appreciated.   Lungs: Clear with good BS bilat with no rales, rhonchi, or wheezes  Heart: Auscultation: RRR.  Abdomen: Normoactive bowel sounds, soft, nontender, without masses or organomegaly palpable   Extremities:  BL LE: No pretibial edema normal ROM and strength.  Mental Status: Judgment, insight: Intact Orientation: Oriented to time, place, and person Mood and affect: No depression, anxiety, or agitation     DATA REVIEWED:   Latest Reference Range & Units 04/19/21 10:37  TSH 0.35 - 5.50 uIU/mL 2.16     ASSESSMENT / PLAN / RECOMMENDATIONS:   Hypothyroidism:  - Pt is clinically euthyroid - No local neck symptoms    Medications   Levothyroxine 50 mcg daily    F/U in 1 yr    Signed electronically by: Mack Guise, MD  [facility]  [facility] [address] [place], [place] [address] Phone: [phone] FAX: [phone]      CC: Colon Branch, [address] [place] [address] Phone: [phone]  Fax: [phone]   Return to Endocrinology clinic as below: Future Appointments  Date Time Provider [place]  12/26/2021  9:00 AM Colon Branch, MD [facility]  04/06/2022  1:40 PM [facility] HEALTH COACH [facility]  04/25/2022  1:00 PM Adelyn Roscher, Melanie Crazier, MD [facility] [place]

## 2021-04-20 DIAGNOSIS — L905 Scar conditions and fibrosis of skin: Secondary | ICD-10-CM | POA: Diagnosis not present

## 2021-04-27 DIAGNOSIS — R3912 Poor urinary stream: Secondary | ICD-10-CM | POA: Diagnosis not present

## 2021-04-27 DIAGNOSIS — R8271 Bacteriuria: Secondary | ICD-10-CM | POA: Diagnosis not present

## 2021-04-27 DIAGNOSIS — R972 Elevated prostate specific antigen [PSA]: Secondary | ICD-10-CM | POA: Diagnosis not present

## 2021-04-27 DIAGNOSIS — N401 Enlarged prostate with lower urinary tract symptoms: Secondary | ICD-10-CM | POA: Diagnosis not present

## 2021-06-13 DIAGNOSIS — L570 Actinic keratosis: Secondary | ICD-10-CM | POA: Diagnosis not present

## 2021-06-13 DIAGNOSIS — D1801 Hemangioma of skin and subcutaneous tissue: Secondary | ICD-10-CM | POA: Diagnosis not present

## 2021-06-13 DIAGNOSIS — Z85828 Personal history of other malignant neoplasm of skin: Secondary | ICD-10-CM | POA: Diagnosis not present

## 2021-06-13 DIAGNOSIS — L578 Other skin changes due to chronic exposure to nonionizing radiation: Secondary | ICD-10-CM | POA: Diagnosis not present

## 2021-09-28 DIAGNOSIS — E611 Iron deficiency: Secondary | ICD-10-CM | POA: Diagnosis not present

## 2021-09-28 DIAGNOSIS — C8584 Other specified types of non-Hodgkin lymphoma, lymph nodes of axilla and upper limb: Secondary | ICD-10-CM | POA: Diagnosis not present

## 2021-09-28 DIAGNOSIS — C8510 Unspecified B-cell lymphoma, unspecified site: Secondary | ICD-10-CM | POA: Diagnosis not present

## 2021-09-28 DIAGNOSIS — Z862 Personal history of diseases of the blood and blood-forming organs and certain disorders involving the immune mechanism: Secondary | ICD-10-CM | POA: Diagnosis not present

## 2021-09-28 DIAGNOSIS — Z9221 Personal history of antineoplastic chemotherapy: Secondary | ICD-10-CM | POA: Diagnosis not present

## 2021-09-28 DIAGNOSIS — E039 Hypothyroidism, unspecified: Secondary | ICD-10-CM | POA: Diagnosis not present

## 2021-10-06 DIAGNOSIS — E785 Hyperlipidemia, unspecified: Secondary | ICD-10-CM | POA: Diagnosis not present

## 2021-10-06 DIAGNOSIS — R7303 Prediabetes: Secondary | ICD-10-CM | POA: Diagnosis not present

## 2021-10-06 DIAGNOSIS — E039 Hypothyroidism, unspecified: Secondary | ICD-10-CM | POA: Diagnosis not present

## 2021-10-06 DIAGNOSIS — E790 Hyperuricemia without signs of inflammatory arthritis and tophaceous disease: Secondary | ICD-10-CM | POA: Diagnosis not present

## 2021-10-06 DIAGNOSIS — E611 Iron deficiency: Secondary | ICD-10-CM | POA: Diagnosis not present

## 2021-10-06 LAB — CBC AND DIFFERENTIAL
HCT: 40 — AB (ref 41–53)
Hemoglobin: 13.3 — AB (ref 13.5–17.5)
Neutrophils Absolute: 5.2
Platelets: 142 10*3/uL — AB (ref 150–400)
WBC: 7.1

## 2021-10-06 LAB — VITAMIN B12: Vitamin B-12: 1038

## 2021-10-06 LAB — CBC: RBC: 4.91 (ref 3.87–5.11)

## 2021-10-14 DIAGNOSIS — H53021 Refractive amblyopia, right eye: Secondary | ICD-10-CM | POA: Diagnosis not present

## 2021-10-14 DIAGNOSIS — H47292 Other optic atrophy, left eye: Secondary | ICD-10-CM | POA: Diagnosis not present

## 2021-10-14 DIAGNOSIS — Z961 Presence of intraocular lens: Secondary | ICD-10-CM | POA: Diagnosis not present

## 2021-10-14 DIAGNOSIS — H31092 Other chorioretinal scars, left eye: Secondary | ICD-10-CM | POA: Diagnosis not present

## 2021-11-06 ENCOUNTER — Other Ambulatory Visit: Payer: Self-pay | Admitting: Internal Medicine

## 2021-12-16 DIAGNOSIS — K7290 Hepatic failure, unspecified without coma: Secondary | ICD-10-CM | POA: Diagnosis not present

## 2021-12-16 DIAGNOSIS — R195 Other fecal abnormalities: Secondary | ICD-10-CM | POA: Diagnosis not present

## 2021-12-16 DIAGNOSIS — N289 Disorder of kidney and ureter, unspecified: Secondary | ICD-10-CM | POA: Diagnosis not present

## 2021-12-16 DIAGNOSIS — E039 Hypothyroidism, unspecified: Secondary | ICD-10-CM | POA: Diagnosis not present

## 2021-12-16 DIAGNOSIS — E559 Vitamin D deficiency, unspecified: Secondary | ICD-10-CM | POA: Diagnosis not present

## 2021-12-16 DIAGNOSIS — R7982 Elevated C-reactive protein (CRP): Secondary | ICD-10-CM | POA: Diagnosis not present

## 2021-12-16 DIAGNOSIS — E611 Iron deficiency: Secondary | ICD-10-CM | POA: Diagnosis not present

## 2021-12-16 DIAGNOSIS — D696 Thrombocytopenia, unspecified: Secondary | ICD-10-CM | POA: Diagnosis not present

## 2021-12-16 DIAGNOSIS — E7841 Elevated Lipoprotein(a): Secondary | ICD-10-CM | POA: Diagnosis not present

## 2021-12-26 ENCOUNTER — Ambulatory Visit (INDEPENDENT_AMBULATORY_CARE_PROVIDER_SITE_OTHER): Payer: Medicare HMO | Admitting: Internal Medicine

## 2021-12-26 ENCOUNTER — Encounter: Payer: Self-pay | Admitting: Internal Medicine

## 2021-12-26 VITALS — BP 132/68 | HR 54 | Temp 98.1°F | Resp 16 | Ht 68.0 in | Wt 151.2 lb

## 2021-12-26 DIAGNOSIS — E7800 Pure hypercholesterolemia, unspecified: Secondary | ICD-10-CM

## 2021-12-26 DIAGNOSIS — R739 Hyperglycemia, unspecified: Secondary | ICD-10-CM | POA: Diagnosis not present

## 2021-12-26 DIAGNOSIS — Z0000 Encounter for general adult medical examination without abnormal findings: Secondary | ICD-10-CM

## 2021-12-26 DIAGNOSIS — R7989 Other specified abnormal findings of blood chemistry: Secondary | ICD-10-CM | POA: Diagnosis not present

## 2021-12-26 LAB — BASIC METABOLIC PANEL
BUN: 20 mg/dL (ref 6–23)
CO2: 29 mEq/L (ref 19–32)
Calcium: 9.1 mg/dL (ref 8.4–10.5)
Chloride: 105 mEq/L (ref 96–112)
Creatinine, Ser: 1.64 mg/dL — ABNORMAL HIGH (ref 0.40–1.50)
GFR: 38.65 mL/min — ABNORMAL LOW (ref >60.00)
Glucose, Bld: 118 mg/dL — ABNORMAL HIGH (ref 70–99)
Potassium: 4.2 mEq/L (ref 3.5–5.1)
Sodium: 142 mEq/L (ref 135–145)

## 2021-12-26 LAB — LIPID PANEL
Cholesterol: 156 mg/dL (ref 0–200)
HDL: 36.6 mg/dL — ABNORMAL LOW (ref >39.00)
LDL Cholesterol: 102 mg/dL — ABNORMAL HIGH (ref 0–99)
NonHDL: 119.54
Total CHOL/HDL Ratio: 4
Triglycerides: 89 mg/dL (ref 0.0–149.0)
VLDL: 17.8 mg/dL (ref 0.0–40.0)

## 2021-12-26 LAB — HEMOGLOBIN A1C: Hgb A1c MFr Bld: 5.9 % (ref 4.6–6.5)

## 2021-12-26 NOTE — Assessment & Plan Note (Signed)
Here for CPX Hyperglycemia: Check A1c High cholesterol: Diet controlled, checking labs Thyroid disease: Per Endo H/o Low-grade B-cell lymphoma: Sees hematology regularly, last visit 09/28/2021, they noted chronic mild thrombocytopenia, iron deficiency felt to be due to poor absorption. Asthma: Asymptomatic, on no meds History of skin cancer: Sees Derm regularly Gait disorder: Had 1 fall, prevention discussed.  Fortunately he goes to the gym, do the stationary bike and do some muscle toning exercises. RTC 1 year

## 2021-12-26 NOTE — Progress Notes (Signed)
Subjective:    Patient ID: Kyle Wise, male    DOB: 1941/01/31, 81 y.o.   MRN: [id number]  DOS:  12/26/2021 Type of visit - description: Here for CPX  Since the last office visit is doing well and has no major concerns. When asked, did have a mechanical fall x1 over the last year, no major injury.  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   low grade b-cell lymphoma   Osteoporosis     Past Surgical History:  Procedure Laterality Date   APPENDECTOMY  1999   BRAIN SURGERY  12/2008   meningioma    COLON SURGERY  1999   colon resection   EYE SURGERY     cataract   KNEE ARTHROSCOPY Left 05/2009   PARTIAL KNEE ARTHROPLASTY Left 02/03/2020   Procedure: UNICOMPARTMENTAL KNEE;  Surgeon: Marchia Bond, MD;  Location: [facility];  Service: Orthopedics;  Laterality: Left;  combo anestesia of adductor canal block and spinal   PROSTATE ABLATION  2009   "microwave procedure", Dr Hazle Nordmann   TONSILLECTOMY AND ADENOIDECTOMY  1945   Social History   Socioeconomic History   Marital status: Married    Spouse name: Not on file   Number of children: 2   Years of education: Not on file   Highest education level: Not on file  Occupational History   Occupation: retiredProgramme researcher, broadcasting/film/video for [employer]  Tobacco Use   Smoking status: Never   Smokeless tobacco: Never  Substance and Sexual Activity   Alcohol use: Yes    Comment: socially   Drug use: Never   Sexual activity: Not on file  Other Topics Concern   Not on file  Social History Narrative   Household: Pt and wife   Social Determinants of Health   Financial Resource Strain: Low Risk  (04/04/2021)    Overall Financial Resource Strain (CARDIA)    Difficulty of Paying Living Expenses: Not hard at all  Food Insecurity: No Food Insecurity (04/04/2021)   Hunger Vital Sign    Worried About Running Out of Food in the Last Year: Never true    [place] in the Last Year: Never true  Transportation Needs: No Transportation Needs (04/04/2021)   PRAPARE - Hydrologist (Medical): No    Lack of Transportation (Non-Medical): No  Physical Activity: Sufficiently Active (04/04/2021)   Exercise Vital Sign    Days of Exercise per Week: 5 days    Minutes of Exercise per Session: 60 min  Stress: No Stress Concern Present (04/04/2021)   [place]    Feeling of Stress : Not at all  Social Connections: Moderately Integrated (04/04/2021)   Social Connection and Isolation Panel [NHANES]    Frequency of Communication with Friends and Family: More than three times a week    Frequency of Social Gatherings with Friends and Family: More than three times a week    Attends Religious  Services: Never    Active Member of Clubs or Organizations: Yes    Attends Music therapist: More than 4 times per year    Marital Status: Married  Human resources officer Violence: Not At Risk (04/04/2021)   Humiliation, Afraid, Rape, and Kick questionnaire    Fear of Current or Ex-Partner: No    Emotionally Abused: No    Physically Abused: No    Sexually Abused: No    Current Outpatient Medications  Medication Instructions   EUTHYROX 50 MCG tablet TAKE 1 TABLET EVERY DAY   melatonin 5 mg, Oral, Daily at bedtime   Multiple Vitamin (MULTIVITAMIN WITH MINERALS) TABS tablet 1 tablet, Oral, Daily   tamsulosin (FLOMAX) 0.4 mg, Oral, 2 times daily       Objective:   Physical Exam BP 132/68   Pulse (!) 54   Temp 98.1 F (36.7 C) (Oral)   Resp 16   Ht '5\' 8"'$  (1.727 m)   Wt 151 lb 4 oz (68.6 kg)   SpO2 96%   BMI 23.00 kg/m   General: Well developed, NAD, BMI noted Neck: No  thyromegaly  HEENT:  Normocephalic . Face symmetric, atraumatic Lungs:  CTA B Normal respiratory effort, no intercostal retractions, no accessory muscle use. Heart: RRR,  no murmur.  Abdomen:  Not distended, soft, non-tender. No rebound or rigidity.   Lower extremities: no pretibial edema bilaterally  Skin: Exposed areas without rash. Not pale. Not jaundice Neurologic:  alert & oriented X3.  Speech normal, gait appropriate for age and unassisted Strength symmetric and appropriate for age.  Psych: Cognition and judgment appear intact.  Cooperative with normal attention span and concentration.  Behavior appropriate. No anxious or depressed appearing.     Assessment     ASSESSMENT  Hyperglycemia High cholesterol Thyroid disease (Used to see [facility] NP, h/o iatrogenic hyperthyroidism, sees ENDO now) Asthma DJD, history of knee arthroscopy 2011 Low-grade B-cell lymphoma, hematology [facility]: Elevated PSA, BPH, LUTS, ureteral stricture --[facility] September 2006 Dr. Burnell Blanks --Recurrent UTIs due to Citrobacter despite appropriate antibiotics. Not Rx rec for asx bacteriuria --Elevated PSA, range between 3 and 7, prostate biopsy 2005, + basal cell hyperplasia without malignancy -- Prostate MRI 08/2016 with no evidence of high-grade prostate cancer. Dr Alinda Money Legally blind due to meningioma surgery Colon polyp: s/p partial colectomy 1999, Dr Dorrene German H/o skin cancer , sees derm  PLAN: Here for CPX Hyperglycemia: Check A1c High cholesterol: Diet controlled, checking labs Thyroid disease: Per Endo H/o Low-grade B-cell lymphoma: Sees hematology regularly, last visit 09/28/2021, they noted chronic mild thrombocytopenia, iron deficiency felt to be due to poor absorption. Asthma: Asymptomatic, on no meds History of skin cancer: Sees Derm regularly Gait disorder: Had 1 fall, prevention discussed.  Fortunately he goes to the gym, do the  stationary bike and do some muscle toning exercises. RTC 1 year

## 2021-12-26 NOTE — Assessment & Plan Note (Signed)
Td 2020 PNM 23: 2020 PNM 13: 2018 S/p shingrix Covid vax : booster recommended Flu shot  rec q year [facility]: Colonoscopy 09/01/2015: + Polyps. Saw GI 08/18/2019, Cscope 08/22/2019: Normal.  No evidence of AVMs, mass, colitis.  + Small hemorrhoids.Next per GI Prostate Ca screening :  last PSA 2020 ([facility]) 4.2.  Recommend to see urology yearly and request a copy of the note to be sent to this office  Recent labs reviewed: 09/28/2021: Creatinine 1.5, calcium 9.1, LFTs normal.  Hemoglobin 13.7, ferritin is slightly low, platelets 157. We will recheck BMP, FLP, A1c POA: On file

## 2021-12-26 NOTE — Patient Instructions (Addendum)
Vaccines  I recommend: - COVID booster - Flu shot every fall  GO TO THE LAB : Get the blood work     [place], [place] back for   a physical exam in 1 year     Fall Prevention in the Home, Adult Falls can cause injuries and affect people of all ages. There are many simple things that you can do to make your home safe and to help prevent falls. Ask for help when making these changes, if needed. What actions can I take to prevent falls? General instructions Use good lighting in all rooms. Replace any light bulbs that burn out, turn on lights if it is dark, and use night-lights. Place frequently used items in easy-to-reach places. Lower the shelves around your home if necessary. Set up furniture so that there are clear paths around it. Avoid moving your furniture around. Remove throw rugs and other tripping hazards from the floor. Avoid walking on wet floors. Fix any uneven floor surfaces. Add color or contrast paint or tape to grab bars and handrails in your home. Place contrasting color strips on the first and last steps of staircases. When you use a stepladder, make sure that it is completely opened and that the sides and supports are firmly locked. Have someone hold the ladder while you are using it. Do not climb a closed stepladder. Know where your pets are when moving through your home. What can I do in the bathroom?     Keep the floor dry. Immediately clean up any water that is on the floor. Remove soap buildup in the tub or shower regularly. Use nonskid mats or decals on the floor of the tub or shower. Attach bath mats securely with double-sided, nonslip rug tape. If you need to sit down while you are in the shower, use a plastic, nonslip stool. Install grab bars by the toilet and in the tub and shower. Do not use towel bars as grab bars. What can I do in the bedroom? Make sure that a bedside light is easy to reach. Do not use  oversized bedding that reaches the floor. Have a firm chair that has side arms to use for getting dressed. What can I do in the kitchen? Clean up any spills right away. If you need to reach for something above you, use a sturdy step stool that has a grab bar. Keep electrical cables out of the way. Do not use floor polish or wax that makes floors slippery. If you must use wax, make sure that it is non-skid floor wax. What can I do with my stairs? Do not leave any items on the stairs. Make sure that you have a light switch at the top and the bottom of the stairs. Have them installed if you do not have them. Make sure that there are handrails on both sides of the stairs. Fix handrails that are broken or loose. Make sure that handrails are as long as the staircases. Install non-slip stair treads on all stairs in your home. Avoid having throw rugs at the top or bottom of stairs, or secure the rugs with carpet tape to prevent them from moving. Choose a carpet design that does not hide the edge of steps on the stairs. Check any carpeting to make sure that it is firmly attached to the stairs. Fix any carpet that is loose or worn. What can I do on the outside of my home? Use bright outdoor  lighting. Regularly repair the edges of walkways and driveways and fix any cracks. Remove high doorway thresholds. Trim any shrubbery on the main path into your home. Regularly check that handrails are securely fastened and in good repair. Both sides of all steps should have handrails. Install guardrails along the edges of any raised decks or porches. Clear walkways of debris and clutter, including tools and rocks. Have leaves, snow, and ice cleared regularly. Use sand or salt on walkways during winter months. In the garage, clean up any spills right away, including grease or oil spills. What other actions can I take? Wear closed-toe shoes that fit well and support your feet. Wear shoes that have rubber soles or  low heels. Use mobility aids as needed, such as canes, walkers, scooters, and crutches. Review your medicines with your health care provider. Some medicines can cause dizziness or changes in blood pressure, which increase your risk of falling. Talk with your health care provider about other ways that you can decrease your risk of falls. This may include working with a physical therapist or trainer to improve your strength, balance, and endurance. Where to find more information Centers for Disease Control and Prevention, [employer]: http://www.wolf.info/ National Institute on Aging: http://kim-miller.com/ Contact a health care provider if: You are afraid of falling at home. You feel weak, drowsy, or dizzy at home. You fall at home. Summary There are many simple things that you can do to make your home safe and to help prevent falls. Ways to make your home safe include removing tripping hazards and installing grab bars in the bathroom. Ask for help when making these changes in your home. This information is not intended to replace advice given to you by your health care provider. Make sure you discuss any questions you have with your health care provider. Document Revised: 02/14/2021 Document Reviewed: 12/17/2019 Elsevier Patient Education  [place].

## 2021-12-28 DIAGNOSIS — L570 Actinic keratosis: Secondary | ICD-10-CM | POA: Diagnosis not present

## 2021-12-28 DIAGNOSIS — Z85828 Personal history of other malignant neoplasm of skin: Secondary | ICD-10-CM | POA: Diagnosis not present

## 2021-12-28 DIAGNOSIS — D1801 Hemangioma of skin and subcutaneous tissue: Secondary | ICD-10-CM | POA: Diagnosis not present

## 2021-12-28 DIAGNOSIS — L578 Other skin changes due to chronic exposure to nonionizing radiation: Secondary | ICD-10-CM | POA: Diagnosis not present

## 2021-12-28 DIAGNOSIS — L821 Other seborrheic keratosis: Secondary | ICD-10-CM | POA: Diagnosis not present

## 2021-12-28 NOTE — Addendum Note (Signed)
Addended byDamita Dunnings D on: 12/28/2021 09:54 AM   Modules accepted: Orders

## 2022-01-04 ENCOUNTER — Encounter: Payer: Self-pay | Admitting: Internal Medicine

## 2022-01-04 NOTE — Telephone Encounter (Signed)
Labs received. Placed in PCP red folder.

## 2022-01-05 ENCOUNTER — Encounter: Payer: Self-pay | Admitting: Internal Medicine

## 2022-01-18 ENCOUNTER — Other Ambulatory Visit (INDEPENDENT_AMBULATORY_CARE_PROVIDER_SITE_OTHER): Payer: Medicare HMO

## 2022-01-18 DIAGNOSIS — R7989 Other specified abnormal findings of blood chemistry: Secondary | ICD-10-CM | POA: Diagnosis not present

## 2022-01-19 LAB — BASIC METABOLIC PANEL
BUN: 32 mg/dL — ABNORMAL HIGH (ref 6–23)
CO2: 27 mEq/L (ref 19–32)
Calcium: 8.9 mg/dL (ref 8.4–10.5)
Chloride: 100 mEq/L (ref 96–112)
Creatinine, Ser: 1.88 mg/dL — ABNORMAL HIGH (ref 0.40–1.50)
GFR: 32.79 mL/min — ABNORMAL LOW (ref >60.00)
Glucose, Bld: 73 mg/dL (ref 70–99)
Potassium: 4.3 mEq/L (ref 3.5–5.1)
Sodium: 137 mEq/L (ref 135–145)

## 2022-01-20 ENCOUNTER — Other Ambulatory Visit: Payer: Self-pay | Admitting: Family

## 2022-01-20 DIAGNOSIS — N289 Disorder of kidney and ureter, unspecified: Secondary | ICD-10-CM

## 2022-04-06 ENCOUNTER — Ambulatory Visit (INDEPENDENT_AMBULATORY_CARE_PROVIDER_SITE_OTHER): Payer: Medicare HMO | Admitting: *Deleted

## 2022-04-06 VITALS — BP 147/56 | HR 64 | Ht 68.0 in | Wt 153.0 lb

## 2022-04-06 DIAGNOSIS — Z0000 Encounter for general adult medical examination without abnormal findings: Secondary | ICD-10-CM | POA: Diagnosis not present

## 2022-04-06 NOTE — Progress Notes (Signed)
Subjective:   Kyle Wise is a 81 y.o. male who presents for Medicare Annual/Subsequent preventive examination.  Review of Systems    Defer to PCP Cardiac Risk Factors include: male gender;advanced age (>102mn, >>1women)     Objective:    Today's Vitals   04/06/22 1349  BP: (!) 147/56  Pulse: 64  Weight: 153 lb (69.4 kg)  Height: '5\' 8"'$  (1.727 m)   Body mass index is 23.26 kg/m.     04/06/2022    1:52 PM 04/04/2021   11:47 AM 02/03/2020   11:05 AM 01/26/2020    4:07 PM  Advanced Directives  Does Patient Have a Medical Advance Directive? Yes Yes Yes Yes  Type of AParamed[place]of A[place]bury LakeLiving will [place]leenLiving will [place]Living will H[place]es[place]Living will  Does patient want to make changes to medical advance directive?   No - Patient declined No - Patient declined  Copy of [place] Chart? No - copy requested Yes - validated most recent copy scanned in chart (See row information) No - copy requested No - copy requested    Current Medications (verified) Outpatient Encounter Medications as of 04/06/2022  Medication Sig   EUTHYROX 50 MCG tablet TAKE 1 TABLET EVERY DAY   melatonin 5 MG TABS Take 5 mg by mouth at bedtime.   Multiple Vitamin (MULTIVITAMIN WITH MINERALS) TABS tablet Take 1 tablet by mouth daily.   tamsulosin (FLOMAX) 0.4 MG CAPS capsule Take 0.4 mg by mouth 2 (two) times daily.   No facility-administered encounter medications on file as of 04/06/2022.    Allergies (verified) Patient has no known allergies.   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   Osteoporosis    Past  Surgical History:  Procedure Laterality Date   APPENDECTOMY  1999   BRAIN SURGERY  12/2008   meningioma    COLON SURGERY  1999   colon resection   EYE SURGERY     cataract   KNEE ARTHROSCOPY Left 05/2009   PARTIAL KNEE ARTHROPLASTY Left 02/03/2020   Procedure: UNICOMPARTMENTAL KNEE;  Surgeon: LMarchia Bond MD;  [facility]  Service: Orthopedics;  Laterality: Left;  combo anestesia of adductor canal block and spinal   PROSTATE ABLATION  2009   "microwave procedure", Dr PHazle Nordmann  TONSILLECTOMY AND ADENOIDECTOMY  1945   Family History  Problem Relation Age of Onset   CAD Mother        MI age 83  Arthritis Mother    Heart attack Mother    Cancer Mother    Colon cancer Father    Arthritis Father    Diabetes Maternal Grandmother    CAD Maternal Grandfather    Heart attack Maternal Grandfather    Asthma Brother    Arthritis Brother    Cancer Paternal Grandmother    Prostate cancer Neg Hx    Social History   Socioeconomic History   Marital status: Married    Spouse name: Not on file  Number of children: 2   Years of education: Not on file   Highest education level: Not on file  Occupational History   Occupation: retiredProgramme researcher, broadcasting/film/video for [employer]  Tobacco Use   Smoking status: Never   Smokeless tobacco: Never  Substance and Sexual Activity   Alcohol use: Yes    Comment: socially   Drug use: Never   Sexual activity: Not on file  Other Topics Concern   Not on file  Social History Narrative   Household: Pt and wife   Social Determinants of Health   Financial Resource Strain: Low Risk  (04/04/2021)   Overall Financial Resource Strain (CARDIA)    Difficulty of Paying Living Expenses: Not hard at all  Food Insecurity: No Food Insecurity (04/06/2022)   Hunger Vital Sign    Worried About Running Out of Food in the Last Year: Never true    [place] in the Last Year: Never true  Transportation Needs: No Transportation Needs (04/06/2022)    PRAPARE - Hydrologist (Medical): No    Lack of Transportation (Non-Medical): No  Physical Activity: Sufficiently Active (04/04/2021)   Exercise Vital Sign    Days of Exercise per Week: 5 days    Minutes of Exercise per Session: 60 min  Stress: No Stress Concern Present (04/04/2021)   [place]    Feeling of Stress : Not at all  Social Connections: Moderately Integrated (04/04/2021)   Social Connection and Isolation Panel [NHANES]    Frequency of Communication with Friends and Family: More than three times a week    Frequency of Social Gatherings with Friends and Family: More than three times a week    Attends Religious Services: Never    Marine scientist or Organizations: Yes    Attends Music therapist: More than 4 times per year    Marital Status: Married    Tobacco Counseling Counseling given: Not Answered   Clinical Intake:  Pre-visit preparation completed: Yes  Pain : No/denies pain  Diabetes: No  How often do you need to have someone help you when you read instructions, pamphlets, or other written materials from your doctor or pharmacy?: 1 - Never   Activities of Daily Living    04/06/2022    1:54 PM  In your present state of health, do you have any difficulty performing the following activities:  Hearing? 1  Comment wears hearing aids  Vision? 1  Comment legally blind  Difficulty concentrating or making decisions? 0  Walking or climbing stairs? 0  Dressing or bathing? 0  Doing errands, shopping? 1  Comment legally blind-doesn't drive  Preparing Food and eating ? N  Using the Toilet? N  In the past six months, have you accidently leaked urine? Y  Do you have problems with loss of bowel control? N  Managing your Medications? N  Managing your Finances? N  Housekeeping or managing your Housekeeping? N    Patient Care Team: Colon Branch, MD as PCP  - General (Internal Medicine) Rhoton, Shelda Altes., MD as Consulting Physician (Gastroenterology) Dasher, Jeneen Rinks, MD as Consulting Physician (Surgery) Zebedee Iba., MD as Referring Physician (Hematology and Oncology) Earlie Server, MD as Consulting Physician (Orthopedic Surgery) Raynelle Bring, MD as Consulting Physician (Urology) Luberta Mutter, MD as Consulting Physician (Ophthalmology) Arlis Porta, MD as Consulting Physician (Dermatology) Bryson Corona, NP as Nurse Practitioner (Family Medicine)  Indicate any recent  Medical Services you may have received from other than [facility] providers in the past year (date may be approximate).     Assessment:   This is a routine wellness examination for [place].  Hearing/Vision screen No results found.  Dietary issues and exercise activities discussed: Current Exercise Habits: Home exercise routine (goes to the [facility]), Type of exercise: strength training/weights;Other - see comments (stationary bike), Time (Minutes): 60, Frequency (Times/Week): 5, Weekly Exercise (Minutes/Week): 300, Intensity: Mild, Exercise limited by: None identified   Goals Addressed   None    Depression Screen    04/06/2022    1:53 PM 12/26/2021    9:04 AM 04/04/2021   11:49 AM 12/23/2020    9:59 AM 06/25/2020   10:26 AM 08/19/2019    9:01 AM 12/20/2018    9:43 AM  PHQ 2/9 Scores  PHQ - 2 Score 0 0 0 0 0 0 0    Fall Risk    04/06/2022    1:52 PM 12/26/2021    9:04 AM 04/04/2021   11:48 AM 12/23/2020    9:59 AM 06/25/2020   10:26 AM  Fall Risk   Falls in the past year? 1 1 0 0 0  Number falls in past yr: 0 0 0 0 0  Comment foot was asleep      Injury with Fall? 0 0 0 0 0  Risk for fall due to : No Fall Risks  Impaired vision    Follow up Falls evaluation completed Falls evaluation completed Falls prevention discussed Falls evaluation completed     FALL RISK PREVENTION PERTAINING TO THE HOME:  Any stairs in or around the home? Yes  If so, are there any  without handrails? No  Home free of loose throw rugs in walkways, pet beds, electrical cords, etc? Yes  Adequate lighting in your home to reduce risk of falls? Yes   ASSISTIVE DEVICES UTILIZED TO PREVENT FALLS:  Life alert? No  Use of a cane, walker or w/c? No  Grab bars in the bathroom? No  Shower chair or bench in shower? No  Elevated toilet seat or a handicapped toilet? Yes   TIMED UP AND GO:  Was the test performed? Yes .  Length of time to ambulate 10 feet: 5 sec.   Gait steady and fast without use of assistive device  Cognitive Function:        04/06/2022    2:01 PM  6CIT Screen  What Year? 0 points  What month? 0 points  What time? 0 points  Count back from 20 0 points  Months in reverse 2 points  Repeat phrase 6 points  Total Score 8 points    Immunizations Immunization History  Administered Date(s) Administered   H1N1 05/14/2008   Influenza Split 02/27/2020   Influenza, High Dose Seasonal PF 04/03/2017   Influenza, Seasonal, Injecte, Preservative Fre 01/25/2019   Influenza,inj,Quad PF,6+ Mos 05/31/2016   Influenza-Unspecified 04/27/2015, 03/08/2018, 03/26/2021   PFIZER(Purple Top)SARS-COV-2 Vaccination 07/03/2019, 07/29/2019, 03/22/2020, 10/13/2020   Pfizer Covid-19 Vaccine Bivalent Booster 5y-11y 03/26/2021   Pneumococcal Conjugate-13 05/31/2016   Pneumococcal Polysaccharide-23 06/17/2018   Td 06/17/2018   Zoster Recombinat (Shingrix) 01/25/2019, 03/28/2019    TDAP status: Up to date  Flu Vaccine status: Due, Education has been provided regarding the importance of this vaccine. Advised may receive this vaccine at local pharmacy or Health Dept. Aware to provide a copy of the vaccination record if obtained from local pharmacy or Health Dept. Verbalized acceptance and understanding.  Pneumococcal vaccine status: Up to date  Covid-19 vaccine status: Information provided on how to obtain vaccines.   Qualifies for Shingles Vaccine? Yes   Zostavax  completed No   Shingrix Completed?: Yes  Screening Tests Health Maintenance  Topic Date Due   COVID-19 Vaccine (6 - Pfizer risk series) 05/21/2021   INFLUENZA VACCINE  12/27/2021   Medicare Annual Wellness (AWV)  04/04/2022   COLONOSCOPY (Pts 45-49yr Insurance coverage will need to be confirmed)  08/21/2024   TETANUS/TDAP  06/17/2028   Pneumonia Vaccine 81 Years old  Completed   Zoster Vaccines- Shingrix  Completed   HPV VACCINES  Aged Out    Health Maintenance  Health Maintenance Due  Topic Date Due   COVID-19 Vaccine (6 - Pfizer risk series) 05/21/2021   INFLUENZA VACCINE  12/27/2021   Medicare Annual Wellness (AWV)  04/04/2022    Colorectal cancer screening: No longer required.   Lung Cancer Screening: (Low Dose CT Chest recommended if Age 602-80years, 30 pack-year currently smoking OR have quit w/in 15years.) does not qualify.   Lung Cancer Screening Referral: N/a  Additional Screening:  Hepatitis C Screening: does not qualify  Vision Screening: Recommended annual ophthalmology exams for early detection of glaucoma and other disorders of the eye. Is the patient up to date with their annual eye exam?  Yes  Who is the provider or what is the name of the office in which the patient attends annual [facility] exams? [facility] If pt is not established with a provider, would they like to be referred to a provider to establish care? No .   Dental Screening: Recommended annual dental exams for proper oral hygiene  Community Resource Referral / Chronic Care Management: CRR required this visit?  No   CCM required this visit?  No      Plan:     I have personally reviewed and noted the following in the patient's chart:   Medical and social history Use of alcohol, tobacco or illicit drugs  Current medications and supplements including opioid prescriptions. Patient is not currently taking opioid prescriptions. Functional ability and status Nutritional status Physical  activity Advanced directives List of other physicians Hospitalizations, surgeries, and ER visits in previous 12 months Vitals Screenings to include cognitive, depression, and falls Referrals and appointments  In addition, I have reviewed and discussed with patient certain preventive protocols, quality metrics, and best practice recommendations. A written personalized care plan for preventive services as well as general preventive health recommendations were provided to patient.     BBeatris Ship CDe Soto  04/06/2022   Nurse Notes: None

## 2022-04-06 NOTE — Patient Instructions (Signed)
Kyle Wise , Thank you for taking time to come for your Medicare Wellness Visit. I appreciate your ongoing commitment to your health goals. Please review the following plan we discussed and let me know if I can assist you in the future.   These are the goals we discussed:  Goals      Patient Stated     Maintain current health & activity level        This is a list of the screening recommended for you and due dates:  Health Maintenance  Topic Date Due   COVID-19 Vaccine (6 - Pfizer risk series) 05/21/2021   Flu Shot  12/27/2021   Medicare Annual Wellness Visit  04/07/2023   Colon Cancer Screening  08/21/2024   Tetanus Vaccine  06/17/2028   Pneumonia Vaccine  Completed   Zoster (Shingles) Vaccine  Completed   HPV Vaccine  Aged Out     Next appointment: Follow up in one year for your annual wellness visit.   Preventive Care 73 Years and Older, Male Preventive care refers to lifestyle choices and visits with your health care provider that can promote health and wellness. What does preventive care include? A yearly physical exam. This is also called an annual well check. Dental exams once or twice a year. Routine eye exams. Ask your health care provider how often you should have your eyes checked. Personal lifestyle choices, including: Daily care of your teeth and gums. Regular physical activity. Eating a healthy diet. Avoiding tobacco and drug use. Limiting alcohol use. Practicing safe sex. Taking low doses of aspirin every day. Taking vitamin and mineral supplements as recommended by your health care provider. What happens during an annual well check? The services and screenings done by your health care provider during your annual well check will depend on your age, overall health, lifestyle risk factors, and family history of disease. Counseling  Your health care provider may ask you questions about your: Alcohol use. Tobacco use. Drug use. Emotional well-being. Home  and relationship well-being. Sexual activity. Eating habits. History of falls. Memory and ability to understand (cognition). Work and work Statistician. Screening  You may have the following tests or measurements: Height, weight, and BMI. Blood pressure. Lipid and cholesterol levels. These may be checked every 5 years, or more frequently if you are over 48 years old. Skin check. Lung cancer screening. You may have this screening every year starting at age 80 if you have a 30-pack-year history of smoking and currently smoke or have quit within the past 15 years. Fecal occult blood test (FOBT) of the stool. You may have this test every year starting at age 45. Flexible sigmoidoscopy or colonoscopy. You may have a sigmoidoscopy every 5 years or a colonoscopy every 10 years starting at age 80. Prostate cancer screening. Recommendations will vary depending on your family history and other risks. Hepatitis C blood test. Hepatitis B blood test. Sexually transmitted disease (STD) testing. Diabetes screening. This is done by checking your blood sugar (glucose) after you have not eaten for a while (fasting). You may have this done every 1-3 years. Abdominal aortic aneurysm (AAA) screening. You may need this if you are a current or former smoker. Osteoporosis. You may be screened starting at age 68 if you are at high risk. Talk with your health care provider about your test results, treatment options, and if necessary, the need for more tests. Vaccines  Your health care provider may recommend certain vaccines, such as: Influenza vaccine. This  is recommended every year. Tetanus, diphtheria, and acellular pertussis (Tdap, Td) vaccine. You may need a Td booster every 10 years. Zoster vaccine. You may need this after age 63. Pneumococcal 13-valent conjugate (PCV13) vaccine. One dose is recommended after age 16. Pneumococcal polysaccharide (PPSV23) vaccine. One dose is recommended after age 55. Talk to  your health care provider about which screenings and vaccines you need and how often you need them. This information is not intended to replace advice given to you by your health care provider. Make sure you discuss any questions you have with your health care provider. Document Released: 06/11/2015 Document Revised: 02/02/2016 Document Reviewed: 03/16/2015 Elsevier Interactive Patient Education  2017 [employer] Prevention in the Home Falls can cause injuries. They can happen to people of all ages. There are many things you can do to make your home safe and to help prevent falls. What can I do on the outside of my home? Regularly fix the edges of walkways and driveways and fix any cracks. Remove anything that might make you trip as you walk through a door, such as a raised step or threshold. Trim any bushes or trees on the path to your home. Use bright outdoor lighting. Clear any walking paths of anything that might make someone trip, such as rocks or tools. Regularly check to see if handrails are loose or broken. Make sure that both sides of any steps have handrails. Any raised decks and porches should have guardrails on the edges. Have any leaves, snow, or ice cleared regularly. Use sand or salt on walking paths during winter. Clean up any spills in your garage right away. This includes oil or grease spills. What can I do in the bathroom? Use night lights. Install grab bars by the toilet and in the tub and shower. Do not use towel bars as grab bars. Use non-skid mats or decals in the tub or shower. If you need to sit down in the shower, use a plastic, non-slip stool. Keep the floor dry. Clean up any water that spills on the floor as soon as it happens. Remove soap buildup in the tub or shower regularly. Attach bath mats securely with double-sided non-slip rug tape. Do not have throw rugs and other things on the floor that can make you trip. What can I do in the bedroom? Use  night lights. Make sure that you have a light by your bed that is easy to reach. Do not use any sheets or blankets that are too big for your bed. They should not hang down onto the floor. Have a firm chair that has side arms. You can use this for support while you get dressed. Do not have throw rugs and other things on the floor that can make you trip. What can I do in the kitchen? Clean up any spills right away. Avoid walking on wet floors. Keep items that you use a lot in easy-to-reach places. If you need to reach something above you, use a strong step stool that has a grab bar. Keep electrical cords out of the way. Do not use floor polish or wax that makes floors slippery. If you must use wax, use non-skid floor wax. Do not have throw rugs and other things on the floor that can make you trip. What can I do with my stairs? Do not leave any items on the stairs. Make sure that there are handrails on both sides of the stairs and use them. Fix handrails that  are broken or loose. Make sure that handrails are as long as the stairways. Check any carpeting to make sure that it is firmly attached to the stairs. Fix any carpet that is loose or worn. Avoid having throw rugs at the top or bottom of the stairs. If you do have throw rugs, attach them to the floor with carpet tape. Make sure that you have a light switch at the top of the stairs and the bottom of the stairs. If you do not have them, ask someone to add them for you. What else can I do to help prevent falls? Wear shoes that: Do not have high heels. Have rubber bottoms. Are comfortable and fit you well. Are closed at the toe. Do not wear sandals. If you use a stepladder: Make sure that it is fully opened. Do not climb a closed stepladder. Make sure that both sides of the stepladder are locked into place. Ask someone to hold it for you, if possible. Clearly mark and make sure that you can see: Any grab bars or handrails. First and last  steps. Where the edge of each step is. Use tools that help you move around (mobility aids) if they are needed. These include: Canes. Walkers. Scooters. Crutches. Turn on the lights when you go into a dark area. Replace any light bulbs as soon as they burn out. Set up your furniture so you have a clear path. Avoid moving your furniture around. If any of your floors are uneven, fix them. If there are any pets around you, be aware of where they are. Review your medicines with your doctor. Some medicines can make you feel dizzy. This can increase your chance of falling. Ask your doctor what other things that you can do to help prevent falls. This information is not intended to replace advice given to you by your health care provider. Make sure you discuss any questions you have with your health care provider. Document Released: 03/11/2009 Document Revised: 10/21/2015 Document Reviewed: 06/19/2014 Elsevier Interactive Patient Education  2017 [employer].

## 2022-04-10 DIAGNOSIS — C8510 Unspecified B-cell lymphoma, unspecified site: Secondary | ICD-10-CM | POA: Diagnosis not present

## 2022-04-10 DIAGNOSIS — Z8639 Personal history of other endocrine, nutritional and metabolic disease: Secondary | ICD-10-CM | POA: Diagnosis not present

## 2022-04-10 DIAGNOSIS — E611 Iron deficiency: Secondary | ICD-10-CM | POA: Diagnosis not present

## 2022-04-10 DIAGNOSIS — C8580 Other specified types of non-Hodgkin lymphoma, unspecified site: Secondary | ICD-10-CM | POA: Diagnosis not present

## 2022-04-10 DIAGNOSIS — Z9221 Personal history of antineoplastic chemotherapy: Secondary | ICD-10-CM | POA: Diagnosis not present

## 2022-04-10 DIAGNOSIS — D649 Anemia, unspecified: Secondary | ICD-10-CM | POA: Diagnosis not present

## 2022-04-10 DIAGNOSIS — Z862 Personal history of diseases of the blood and blood-forming organs and certain disorders involving the immune mechanism: Secondary | ICD-10-CM | POA: Diagnosis not present

## 2022-04-13 DIAGNOSIS — E559 Vitamin D deficiency, unspecified: Secondary | ICD-10-CM | POA: Diagnosis not present

## 2022-04-13 DIAGNOSIS — R972 Elevated prostate specific antigen [PSA]: Secondary | ICD-10-CM | POA: Diagnosis not present

## 2022-04-13 DIAGNOSIS — R7303 Prediabetes: Secondary | ICD-10-CM | POA: Diagnosis not present

## 2022-04-13 DIAGNOSIS — E039 Hypothyroidism, unspecified: Secondary | ICD-10-CM | POA: Diagnosis not present

## 2022-04-13 DIAGNOSIS — E785 Hyperlipidemia, unspecified: Secondary | ICD-10-CM | POA: Diagnosis not present

## 2022-04-13 DIAGNOSIS — E790 Hyperuricemia without signs of inflammatory arthritis and tophaceous disease: Secondary | ICD-10-CM | POA: Diagnosis not present

## 2022-04-13 DIAGNOSIS — E611 Iron deficiency: Secondary | ICD-10-CM | POA: Diagnosis not present

## 2022-04-25 ENCOUNTER — Ambulatory Visit: Payer: Medicare HMO | Admitting: Internal Medicine

## 2022-04-25 DIAGNOSIS — N189 Chronic kidney disease, unspecified: Secondary | ICD-10-CM | POA: Diagnosis not present

## 2022-04-25 DIAGNOSIS — D631 Anemia in chronic kidney disease: Secondary | ICD-10-CM | POA: Diagnosis not present

## 2022-04-25 DIAGNOSIS — N1831 Chronic kidney disease, stage 3a: Secondary | ICD-10-CM | POA: Diagnosis not present

## 2022-04-25 DIAGNOSIS — C8590 Non-Hodgkin lymphoma, unspecified, unspecified site: Secondary | ICD-10-CM | POA: Diagnosis not present

## 2022-04-25 DIAGNOSIS — H548 Legal blindness, as defined in USA: Secondary | ICD-10-CM | POA: Diagnosis not present

## 2022-04-25 DIAGNOSIS — N400 Enlarged prostate without lower urinary tract symptoms: Secondary | ICD-10-CM | POA: Diagnosis not present

## 2022-04-25 DIAGNOSIS — N390 Urinary tract infection, site not specified: Secondary | ICD-10-CM | POA: Diagnosis not present

## 2022-04-25 DIAGNOSIS — N1832 Chronic kidney disease, stage 3b: Secondary | ICD-10-CM | POA: Diagnosis not present

## 2022-04-25 LAB — COMPREHENSIVE METABOLIC PANEL
Albumin: 4.6 (ref 3.5–5.0)
Calcium: 9.4 (ref 8.7–10.7)
Globulin: 3.1
eGFR: 33

## 2022-04-25 LAB — CBC AND DIFFERENTIAL
HCT: 38 — AB (ref 41–53)
Hemoglobin: 12.3 — AB (ref 13.5–17.5)
Neutrophils Absolute: 5
Platelets: 183 10*3/uL (ref 150–400)
WBC: 6.8

## 2022-04-25 LAB — BASIC METABOLIC PANEL
BUN: 28 — AB (ref 4–21)
CO2: 27 — AB (ref 13–22)
Chloride: 98 — AB (ref 99–108)
Creatinine: 2 — AB (ref 0.6–1.3)
Glucose: 81
Potassium: 4.5 mEq/L (ref 3.5–5.1)
Sodium: 140 (ref 137–147)

## 2022-04-25 LAB — VITAMIN D 25 HYDROXY (VIT D DEFICIENCY, FRACTURES): Vit D, 25-Hydroxy: 62.1

## 2022-04-25 LAB — PROTEIN / CREATININE RATIO, URINE: Creatinine, Urine: 58.3

## 2022-04-25 LAB — CBC: RBC: 4.59 (ref 3.87–5.11)

## 2022-04-25 NOTE — Progress Notes (Deleted)
Name: Kyle Wise  MRN/ DOB: [id number], 1940-12-12    Age/ Sex: 81 y.o., male     PCP: Colon Branch, MD   Reason for Endocrinology Evaluation: Hypothyroidism     Initial Endocrinology Clinic Visit: 01/13/2020    PATIENT IDENTIFIER: Kyle Wise is a 81 y.o., male with a past medical history ofOsteoporosis, low grade B-cell Lymphoma, asthma and dyslipidemia  . He has followed with [facility] since 01/13/2020 for consultative assistance with management of his Hypothyroidism.   HISTORICAL SUMMARY: The patient was first diagnosed with hypothyroidism in 1990 through a holistic doctor, over the years, he was  been on armour thyroid which was discontinued in 11/2019 due to suppressed TSH and switched to LT-4 replacement.      SUBJECTIVE:     Today (04/25/2022):  Kyle Wise is here for for a follow up on hypothyroidism   Weight has been stable Denies constipation or diarrhea  Denies palpitation  No local neck symptoms  Denies depression     S/P Left TKR 01/2020 Has Moh's surgery last week on upper lip     He is on Levothyroxine 50 mcg daily    HISTORY:  Past Medical History:  Past Medical History:  Diagnosis Date   Allergy    seasonal   Anemia    Anxiety    Arthritis    Asthma    no meds now   Frequent UTI    History of colon polyps    Hyperlipidemia    Hypothyroidism    Left knee DJD    Legal blindness    Low grade B-cell lymphoma ([place]) 2005   low grade b-cell lymphoma   Osteoporosis    Past Surgical History:  Past Surgical History:  Procedure Laterality Date   [place] SURGERY  12/2008   meningioma    COLON SURGERY  1999   colon resection   EYE SURGERY     cataract   KNEE ARTHROSCOPY Left 05/2009   PARTIAL KNEE ARTHROPLASTY Left 02/03/2020   Procedure: UNICOMPARTMENTAL KNEE;  Surgeon: Marchia Bond, MD;  Location: [facility];  Service: Orthopedics;  Laterality: Left;  combo anestesia of adductor canal  block and spinal   PROSTATE ABLATION  2009   "microwave procedure", Dr Hazle Nordmann   TONSILLECTOMY AND ADENOIDECTOMY  1945   Social History:  reports that he has never smoked. He has never used smokeless tobacco. He reports current alcohol use. He reports that he does not use drugs. Family History:  Family History  Problem Relation Age of Onset   CAD Mother        MI age 61   Arthritis Mother    Heart attack Mother    Cancer Mother    Colon cancer Father    Arthritis Father    Diabetes Maternal Grandmother    CAD Maternal Grandfather    Heart attack Maternal Grandfather    Asthma Brother    Arthritis Brother    Cancer Paternal Grandmother    Prostate cancer Neg Hx      HOME MEDICATIONS: Allergies as of 04/25/2022   No Known Allergies      Medication List        Accurate as of April 25, 2022  9:26 AM. If you have any questions, ask your nurse or doctor.          Euthyrox 50 MCG tablet Generic drug: levothyroxine TAKE 1 TABLET EVERY DAY  melatonin 5 MG Tabs Take 5 mg by mouth at bedtime.   multivitamin with minerals Tabs tablet Take 1 tablet by mouth daily.   tamsulosin 0.4 MG Caps capsule Commonly known as: FLOMAX Take 0.4 mg by mouth 2 (two) times daily.          OBJECTIVE:   PHYSICAL EXAM: VS: There were no vitals taken for this visit.   EXAM: General: Pt appears well and is in NAD  Neck: General: Supple without adenopathy. Thyroid: Thyroid size normal.  No goiter or nodules appreciated.   Lungs: Clear with good BS bilat with no rales, rhonchi, or wheezes  Heart: Auscultation: RRR.  Abdomen: Normoactive bowel sounds, soft, nontender, without masses or organomegaly palpable  Extremities:  BL LE: No pretibial edema normal ROM and strength.  Mental Status: Judgment, insight: Intact Orientation: Oriented to time, place, and person Mood and affect: No depression, anxiety, or agitation     DATA REVIEWED:   Latest Reference Range & Units  04/19/21 10:37  TSH 0.35 - 5.50 uIU/mL 2.16     ASSESSMENT / PLAN / RECOMMENDATIONS:   Hypothyroidism:  - Pt is clinically euthyroid - No local neck symptoms    Medications   Levothyroxine 50 mcg daily    F/U in 1 yr    Signed electronically by: Mack Guise, MD  [facility] Endocrinology  [facility], [place] [address] Phone: [phone] FAX: [phone]      CC: Colon Branch, [address] [place] [address] Phone: [phone]  Fax: [phone]   Return to Endocrinology clinic as below: Future Appointments  Date Time Provider [place]  04/25/2022  1:00 PM Artavius Stearns, Melanie Crazier, MD [facility] None  01/01/2023  9:00 AM Colon Branch, MD [facility]

## 2022-04-26 ENCOUNTER — Other Ambulatory Visit: Payer: Self-pay | Admitting: Nephrology

## 2022-04-26 DIAGNOSIS — N400 Enlarged prostate without lower urinary tract symptoms: Secondary | ICD-10-CM

## 2022-04-26 DIAGNOSIS — N401 Enlarged prostate with lower urinary tract symptoms: Secondary | ICD-10-CM | POA: Diagnosis not present

## 2022-04-26 DIAGNOSIS — R8271 Bacteriuria: Secondary | ICD-10-CM | POA: Diagnosis not present

## 2022-04-26 DIAGNOSIS — N390 Urinary tract infection, site not specified: Secondary | ICD-10-CM

## 2022-04-26 DIAGNOSIS — R3912 Poor urinary stream: Secondary | ICD-10-CM | POA: Diagnosis not present

## 2022-04-26 DIAGNOSIS — N1832 Chronic kidney disease, stage 3b: Secondary | ICD-10-CM

## 2022-05-03 ENCOUNTER — Encounter: Payer: Self-pay | Admitting: Internal Medicine

## 2022-05-04 ENCOUNTER — Ambulatory Visit
Admission: RE | Admit: 2022-05-04 | Discharge: 2022-05-04 | Disposition: A | Discharge status: Home / Self Care | Payer: Medicare HMO | Source: Ambulatory Visit | Attending: Nephrology | Admitting: Nephrology

## 2022-05-04 DIAGNOSIS — N400 Enlarged prostate without lower urinary tract symptoms: Secondary | ICD-10-CM | POA: Diagnosis not present

## 2022-05-04 DIAGNOSIS — N183 Chronic kidney disease, stage 3 unspecified: Secondary | ICD-10-CM | POA: Diagnosis not present

## 2022-05-04 DIAGNOSIS — N3289 Other specified disorders of bladder: Secondary | ICD-10-CM | POA: Diagnosis not present

## 2022-05-04 DIAGNOSIS — N390 Urinary tract infection, site not specified: Secondary | ICD-10-CM

## 2022-05-04 DIAGNOSIS — N1330 Unspecified hydronephrosis: Secondary | ICD-10-CM | POA: Diagnosis not present

## 2022-05-04 DIAGNOSIS — N1832 Chronic kidney disease, stage 3b: Secondary | ICD-10-CM

## 2022-05-08 DIAGNOSIS — R338 Other retention of urine: Secondary | ICD-10-CM | POA: Diagnosis not present

## 2022-05-08 DIAGNOSIS — N130 Hydronephrosis with ureteropelvic junction obstruction: Secondary | ICD-10-CM | POA: Diagnosis not present

## 2022-06-21 ENCOUNTER — Other Ambulatory Visit: Payer: Self-pay | Admitting: Urology

## 2022-07-05 ENCOUNTER — Encounter (HOSPITAL_COMMUNITY): Payer: Self-pay

## 2022-07-05 ENCOUNTER — Other Ambulatory Visit: Payer: Self-pay

## 2022-07-05 ENCOUNTER — Encounter (HOSPITAL_COMMUNITY)
Admission: RE | Admit: 2022-07-05 | Discharge: 2022-07-05 | Disposition: A | Discharge status: Home / Self Care | Payer: Medicare Other | Source: Ambulatory Visit | Attending: Urology | Admitting: Urology

## 2022-07-05 DIAGNOSIS — E7800 Pure hypercholesterolemia, unspecified: Secondary | ICD-10-CM | POA: Diagnosis not present

## 2022-07-05 DIAGNOSIS — Z01812 Encounter for preprocedural laboratory examination: Secondary | ICD-10-CM | POA: Insufficient documentation

## 2022-07-05 DIAGNOSIS — Z01818 Encounter for other preprocedural examination: Secondary | ICD-10-CM

## 2022-07-05 LAB — BASIC METABOLIC PANEL
Anion gap: 9 (ref 5–15)
BUN: 31 mg/dL — ABNORMAL HIGH (ref 8–23)
CO2: 29 mmol/L (ref 22–32)
Calcium: 8.9 mg/dL (ref 8.9–10.3)
Chloride: 101 mmol/L (ref 98–111)
Creatinine, Ser: 1.71 mg/dL — ABNORMAL HIGH (ref 0.61–1.24)
GFR, Estimated: 39 mL/min — ABNORMAL LOW (ref >60)
Glucose, Bld: 87 mg/dL (ref 70–99)
Potassium: 4 mmol/L (ref 3.5–5.1)
Sodium: 139 mmol/L (ref 135–145)

## 2022-07-05 LAB — CBC
HCT: 44.9 % (ref 39.0–52.0)
Hemoglobin: 14.1 g/dL (ref 13.0–17.0)
MCH: 26.9 pg (ref 26.0–34.0)
MCHC: 31.4 g/dL (ref 30.0–36.0)
MCV: 85.5 fL (ref 80.0–100.0)
Platelets: 184 10*3/uL (ref 150–400)
RBC: 5.25 MIL/uL (ref 4.22–5.81)
RDW: 13.3 % (ref 11.5–15.5)
WBC: 7 10*3/uL (ref 4.0–10.5)
nRBC: 0 % (ref 0.0–0.2)

## 2022-07-05 NOTE — Patient Instructions (Signed)
DUE TO COVID-19 ONLY TWO VISITORS  (aged 84 and older)  ARE ALLOWED TO COME WITH YOU AND STAY IN THE WAITING ROOM ONLY DURING PRE OP AND PROCEDURE.   **NO VISITORS ARE ALLOWED IN THE SHORT STAY AREA OR RECOVERY ROOM!!**  IF YOU WILL BE ADMITTED INTO THE HOSPITAL YOU ARE ALLOWED ONLY FOUR SUPPORT PEOPLE DURING VISITATION HOURS ONLY (7 AM -8PM)   The support person(s) must pass our screening, gel in and out, and wear a mask at all times, including in the patient's room. Patients must also wear a mask when staff or their support person are in the room. Visitors GUEST BADGE MUST BE WORN VISIBLY  One adult visitor may remain with you overnight and MUST be in the room by 8 P.M.     Your procedure is scheduled on: 07/27/22   Report to [facility] Main Entrance    Report to admitting at 8:00  AM   Call this number if you have problems the morning of surgery [phone]   Do not eat food or drink:After Midnight.                           If you have questions, please contact your surgeon's office.   FOLLOW BOWEL PREP AND ANY ADDITIONAL PRE OP INSTRUCTIONS YOU RECEIVED FROM YOUR SURGEON'S OFFICE!!!     Oral Hygiene is also important to reduce your risk of infection.                                    Remember - BRUSH YOUR TEETH THE MORNING OF SURGERY WITH YOUR REGULAR TOOTHPASTE  DENTURES WILL BE REMOVED PRIOR TO SURGERY PLEASE DO NOT APPLY "Poly grip" OR ADHESIVES!!!   Do NOT smoke after Midnight   Take these medicines the morning of surgery with A SIP OF WATER: Finasteride                                                                                                                           Euthyrox  Bring CPAP mask and tubing day of surgery.                              You may not have any metal on your body including  jewelry, and body piercing             Do not wear lotions, powders, cologne, or deodorant                Men may shave face and neck.   Do not  bring valuables to the hospital. [place].   Contacts, glasses, or bridgework may not be worn into surgery.  Bring small overnight bag day of surgery.   DO NOT [place]. PHARMACY WILL DISPENSE MEDICATIONS LISTED ON YOUR MEDICATION LIST TO YOU DURING YOUR ADMISSION [place]!       Special Instructions: Bring a copy of your healthcare power of attorney and living will documents  the day of surgery if you haven't scanned them before.              Please read over the following fact sheets you were given: IF YOU HAVE QUESTIONS ABOUT YOUR PRE-OP INSTRUCTIONS PLEASE CALL [phone]    [facility] - Preparing for Surgery Before surgery, you can play an important role.  Because skin is not sterile, your skin needs to be as free of germs as possible.  You can reduce the number of germs on your skin by washing with CHG (chlorahexidine gluconate) soap before surgery.  CHG is an antiseptic cleaner which kills germs and bonds with the skin to continue killing germs even after washing. Please DO NOT use if you have an allergy to CHG or antibacterial soaps.  If your skin becomes reddened/irritated stop using the CHG and inform your nurse when you arrive at Short Stay..  You may shave your face/neck. Please follow these instructions carefully:  1.  Shower with CHG Soap the night before surgery and the  morning of Surgery.  2.  If you choose to wash your hair, wash your hair first as usual with your  normal  shampoo.  3.  After you shampoo, rinse your hair and body thoroughly to remove the  shampoo.                            4.  Use CHG as you would any other liquid soap.  You can apply chg directly  to the skin and wash                       Gently with a scrungie or clean washcloth.  5.  Apply the CHG Soap to your body ONLY FROM THE NECK DOWN.   Do not use on face/ open                           Wound or open  sores. Avoid contact with eyes, ears mouth and genitals (private parts).                       Wash face,  Genitals (private parts) with your normal soap.             6.  Wash thoroughly, paying special attention to the area where your surgery  will be performed.  7.  Thoroughly rinse your body with warm water from the neck down.  8.  DO NOT shower/wash with your normal soap after using and rinsing off  the CHG Soap.                9.  Pat yourself dry with a clean towel.            10.  Wear clean pajamas.            11.  Place clean sheets on your bed the night of your first shower and do not  sleep with pets. Day of Surgery : Do not apply any lotions/deodorants the morning of surgery.  Please wear clean clothes to the hospital/surgery center.  FAILURE TO FOLLOW THESE INSTRUCTIONS MAY RESULT IN THE CANCELLATION OF YOUR SURGERY   ________________________________________________________________________

## 2022-07-05 NOTE — Progress Notes (Addendum)
Anesthesia note:  Bowel prep reminder:  NA  PCP - Dr. French Ana Cardiologist -none Other-   Chest x-ray - no EKG - no Stress Test - no ECHO - no Cardiac Cath - no CABG-no Pacemaker/ICD device last checked:NA  Sleep Study - yes- negative CPAP - no  Pt is pre diabetic-no CBG at PAT visit- Fasting Blood Sugar at home- Checks Blood Sugar _____  Blood Thinner:no Blood Thinner Instructions: Aspirin Instructions: Last Dose:  Anesthesia review: Yes reason:Creat 1.7  Patient denies shortness of breath, fever, cough and chest pain at PAT appointment. Pt has low grade B-cell lymphoma. He is legally blind. He had asthma as a younger man but it is now resolved. He has no SOB. A foley cath was placed 05/05/22 for urinary retention.   Patient verbalized understanding of instructions that were given to them at the PAT appointment. Patient was also instructed that they will need to review over the PAT instructions again at home before surgery.yes. His wife was with him

## 2022-07-26 NOTE — H&P (Signed)
Office Visit Report     07/11/2022   --------------------------------------------------------------------------------   Kyle Wise  [id number]  DOB: Mar 05, 1941, 82 year old Male  SSN: [id number]   PRIMARY CARE:  Kathlene November, MD  PRIMARY CARE FAX:  [phone]  REFERRING:  Luvenia Redden  PROVIDER:  Raynelle Bring, M.D.  TREATING:  Mcarthur Rossetti, [place]  LOCATION:  [facility] [phone]     --------------------------------------------------------------------------------   CC/HPI: Pt presents today for pre-operative history and physical exam in anticipation of cystoscopy, bilateral retrograde pyelogram, and TURP by Dr. Alinda Money on 07/27/22. He is doing well and is without complaint.   Pt denies F/C, HA, CP, SOB, N/V, diarrhea/constipation, back pain, flank pain, hematuria, and dysuria.    HX:   Urinary retention   Mr. Seeliger returns today after having undergone a urodynamic evaluation after developing bilateral hydronephrosis and acute kidney injury related to urinary retention. He does have an indwelling catheter. His serum creatinine stabilized at 1.6. He did have a recent renal ultrasound which did demonstrate some residual bilateral hydronephrosis indicating that this may be chronic. He otherwise has remained in stable health. 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 This is being followed. He denies any new chest pain or shortness of breath symptoms. He has no history of significant cardiac disease. He remains on both finasteride and tamsulosin. He follows up today after undergoing his urodynamic evaluation and to proceed with cystoscopy.     ALLERGIES: No Allergies    MEDICATIONS: Finasteride 5 mg tablet 1 tablet PO Daily  Tamsulosin Hcl 0.4 mg capsule 2 capsule PO Q HS  Anastrozole 1 mg tablet Oral  Armour Thyroid 60 mg tablet Oral  Iron 325 (65 Fe) MG Oral Tablet Oral  Naltrexone HCl Powder 0 Does Not  Apply     GU PSH: Complex cystometrogram, w/ void pressure and urethral pressure profile studies, any technique - 06/02/2022 Complex Uroflow - 06/02/2022 Cystoscopy - 06/06/2022 Emg surf Electrd - 06/02/2022 Inject For cystogram - 06/02/2022 Intrabd voidng Press - 06/02/2022 Locm 300-'399Mg'$ /Ml Iodine,1Ml - 2018       PSH Notes: Partial Colectomy, Brain Surgery, Cataract Surgery; tonsillectomy, left knee partial replacement 2021    NON-GU PSH: Partial colectomy - 2014     GU PMH: BPH w/LUTS - 06/06/2022, - 04/26/2022, - 04/27/2021, - 04/21/2020, Benign prostatic hyperplasia (BPH) with straining on urination, - 2016 Urinary Retention - 06/06/2022, - 06/02/2022, - 05/08/2022 Hydronephrosis - 05/08/2022 Weak Urinary Stream - 04/26/2022, - 04/27/2021, - 04/21/2020, - 2017 Elevated PSA - 04/27/2021, - 04/21/2020, Elevated prostate specific antigen (PSA), - 2016 Personal Hx Urinary Tract Infections, History of recurrent UTI (urinary tract infection) - 2016 Right renal neoplasm, 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 He is s/p TUMT in September 2006 by Dr. Burnell Blanks without significant change in his symptoms. He also has a history of urethral stricture disease and has undergone multiple dilations of a bulbar urethral stricture by Dr. Burnell Blanks. His baseline IPSS when he initially saw me was 30. His PVR at his initial presentation was > 400 cc. He appeared infected at his initial  evaluation with me and was treated with antibiotics with significant improvement of his symptoms.   Current treatment: Tamsulosin 0.8 mg   Dec 2023: Developed urinary retention with bilateral  hydronephrosis requiring catheter placement   2) Urethral stricture: He has previously undergone TUMT for BPH in September 2006. He has a recurrent bulbar urethral stricture which has been dilated multiple time by Dr. Burnell Blanks in the past.   3) Recurrent UTIs: He developed recurrent or possibly persistent UTIs in 2013-2014. He has had documented recurrent Citrobacter bacteruria despite appropriate antibiotic therapy and documented negative cultures after treatment.   4) Elevated PSA: His PSA has ranged between 3 and 7 while seeing Dr. Burnell Blanks. He had a prostate biopsy performed in October 2005 which demonstrated basal cell hyperplasia without evidence of malignancy.   Apr 2108: MRI of the prostate - no concerning lesions     NON-GU PMH: Bacteriuria (Stable) - 06/06/2022, - 04/26/2022, - 04/27/2021, - 04/21/2020 (Stable), - 2019 (Stable), - 2018, - 2018 Asthma, Asthma - 2014 Benign neoplasm of meninges, unspecified, Meningioma - 2014 Legal blindness, as defined in [place], [place] Definition[place] - 2014 Non-Hodgkin lymphoma, unspecified, unspecified site, Non-Hodgkin's Lymphoma - 2014    FAMILY HISTORY: Colon Cancer - Father Malignant Melanoma Of The Skin - Mother Uterine Cancer - Mother   SOCIAL HISTORY: Marital Status: Married Preferred Language: English; Ethnicity: Not Hispanic Or Latino; Race: White Current Smoking Status: Patient has never smoked.  Does not use smokeless tobacco. Does drink.  Does not use drugs. Drinks 2 caffeinated drinks per day. Has not had a blood transfusion.     Notes: Alcohol Use, Never A Smoker, Marital History - Currently Married, Retired From Work  ETOH wine couple of glasses per day and one beer per week    REVIEW OF SYSTEMS:    GU Review Male:   Patient denies frequent urination, hard to postpone urination, burning/ pain with urination, get up at night to urinate, leakage of urine, stream starts and stops, trouble starting your stream, have to  strain to urinate , erection problems, and penile pain.  Gastrointestinal (Upper):   Patient denies nausea, vomiting, and indigestion/ heartburn.  Gastrointestinal (Lower):   Patient denies diarrhea and constipation.  Constitutional:   Patient denies fever, night sweats, weight loss, and fatigue.  Skin:   Patient denies skin rash/ lesion and itching.  Eyes:   Patient denies double vision and blurred vision.  Ears/ Nose/ Throat:   Patient denies sore throat and sinus problems.  Hematologic/Lymphatic:   Patient denies swollen glands and easy bruising.  Cardiovascular:   Patient denies leg swelling and chest pains.  Respiratory:   Patient denies cough and shortness of breath.  Endocrine:   Patient denies excessive thirst.  Musculoskeletal:   Patient denies back pain and joint pain.  Neurological:   Patient denies headaches and dizziness.  Psychologic:   Patient denies depression and anxiety.   VITAL SIGNS:      07/11/2022 02:39 PM  Weight 154 lb / 69.85 kg  Height 67 in / 170.18 cm  BP 144/69 mmHg  Pulse 67 /min  BMI 24.1 kg/m   MULTI-SYSTEM PHYSICAL EXAMINATION:    Constitutional: Well-nourished. No physical deformities. Normally developed. Good grooming.  Neck: Neck symmetrical, not swollen. Normal tracheal position.  Respiratory: Normal breath sounds. No labored breathing, no use of accessory muscles.   Cardiovascular: Regular rate and rhythm. No murmur, no gallop.   Lymphatic: No enlargement of neck, axillae, groin.  Skin: No paleness,  no jaundice, no cyanosis. No lesion, no ulcer, no rash.  Neurologic / Psychiatric: Oriented to time, oriented to place, oriented to person. No depression, no anxiety, no agitation.  Gastrointestinal: No mass, no tenderness, no rigidity, non obese abdomen.  Eyes: Normal conjunctivae. Normal eyelids.  Ears, Nose, Mouth, and Throat: Left ear no scars, no lesions, no masses. Right ear no scars, no lesions, no masses. Nose no scars, no lesions, no masses.  Normal hearing. Normal lips.  Musculoskeletal: Normal gait and station of head and neck.     Complexity of Data:  Records Review:   Previous Patient Records  Urine Test Review:   Urinalysis   07/11/22  Urinalysis  Urine Appearance Slightly Cloudy   Urine Color Yellow   Urine Glucose Neg mg/dL  Urine Bilirubin Neg mg/dL  Urine Ketones Neg mg/dL  Urine Specific Gravity 1.015   Urine Blood Neg ery/uL  Urine pH 7.0   Urine Protein Neg mg/dL  Urine Urobilinogen 0.2 mg/dL  Urine Nitrites Neg   Urine Leukocyte Esterase 3+ leu/uL  Urine WBC/hpf 40 - 60/hpf   Urine RBC/hpf 0 - 2/hpf   Urine Epithelial Cells NS (Not Seen)   Urine Bacteria Many (>50/hpf)   Urine Mucous Not Present   Urine Yeast NS (Not Seen)   Urine Trichomonas Not Present   Urine Cystals NS (Not Seen)   Urine Casts NS (Not Seen)   Urine Sperm Not Present    PROCEDURES:          Urinalysis w/Scope - 81001 Dipstick Dipstick Cont'd Micro  Color: Yellow Bilirubin: Neg mg/dL WBC/hpf: 40 - 60/hpf  Appearance: Slightly Cloudy Ketones: Neg mg/dL RBC/hpf: 0 - 2/hpf  Specific Gravity: 1.015 Blood: Neg ery/uL Bacteria: Many (>50/hpf)  pH: 7.0 Protein: Neg mg/dL Cystals: NS (Not Seen)  Glucose: Neg mg/dL Urobilinogen: 0.2 mg/dL Casts: NS (Not Seen)    Nitrites: Neg Trichomonas: Not Present    Leukocyte Esterase: 3+ leu/uL Mucous: Not Present      Epithelial Cells: NS (Not Seen)      Yeast: NS (Not Seen)      Sperm: Not Present    ASSESSMENT:      ICD-10 Details  1 GU:   BPH w/LUTS - N40.1   2   Urinary Retention - R33.8    PLAN:           Orders Labs Urine Culture          Schedule Return Visit/Planned Activity: Keep Scheduled Appointment - Schedule Surgery          Document Letter(s):  Created for Patient: Clinical Summary         Notes:   There are no changes in the patients history or physical exam since last evaluation by Dr. Alinda Money. Pt is scheduled to undergo cysto, bilateral retrograde pyelogram,  and TURP on 07/27/22.   Culture on cath specimen to eval for infection prior to surgery.   All pt's questions were answered to the best of my ability.          Next Appointment:      Next Appointment: 07/27/2022 10:15 AM    Appointment Type: Surgery     Location: [facility] [phone]    Provider: Raynelle Bring, M.D.    Reason for Visit: WL/OBS CYSTO, (BL) RPG, TURP      * Signed by Mcarthur Rossetti, PA on 07/11/22 at 3:47 PM (EST)*

## 2022-07-27 ENCOUNTER — Ambulatory Visit (HOSPITAL_COMMUNITY): Payer: Medicare Other | Admitting: Certified Registered"

## 2022-07-27 ENCOUNTER — Other Ambulatory Visit: Payer: Self-pay

## 2022-07-27 ENCOUNTER — Ambulatory Visit (HOSPITAL_BASED_OUTPATIENT_CLINIC_OR_DEPARTMENT_OTHER): Payer: Medicare Other | Admitting: Certified Registered"

## 2022-07-27 ENCOUNTER — Encounter (HOSPITAL_COMMUNITY)
Admission: RE | Disposition: A | Discharge status: Home / Self Care | Payer: Self-pay | Source: Ambulatory Visit | Attending: Urology

## 2022-07-27 ENCOUNTER — Encounter (HOSPITAL_COMMUNITY): Payer: Self-pay | Admitting: Urology

## 2022-07-27 ENCOUNTER — Ambulatory Visit (HOSPITAL_COMMUNITY): Payer: Medicare Other

## 2022-07-27 ENCOUNTER — Observation Stay (HOSPITAL_COMMUNITY)
Admission: RE | Admit: 2022-07-27 | Discharge: 2022-07-28 | Disposition: A | Discharge status: Home / Self Care | Payer: Medicare Other | Source: Ambulatory Visit | Attending: Urology | Admitting: Urology

## 2022-07-27 DIAGNOSIS — N320 Bladder-neck obstruction: Secondary | ICD-10-CM | POA: Insufficient documentation

## 2022-07-27 DIAGNOSIS — N1330 Unspecified hydronephrosis: Secondary | ICD-10-CM | POA: Diagnosis not present

## 2022-07-27 DIAGNOSIS — Z79899 Other long term (current) drug therapy: Secondary | ICD-10-CM | POA: Insufficient documentation

## 2022-07-27 DIAGNOSIS — E039 Hypothyroidism, unspecified: Secondary | ICD-10-CM

## 2022-07-27 DIAGNOSIS — N401 Enlarged prostate with lower urinary tract symptoms: Secondary | ICD-10-CM | POA: Diagnosis not present

## 2022-07-27 DIAGNOSIS — R338 Other retention of urine: Secondary | ICD-10-CM

## 2022-07-27 DIAGNOSIS — N189 Chronic kidney disease, unspecified: Secondary | ICD-10-CM

## 2022-07-27 DIAGNOSIS — Z8572 Personal history of non-Hodgkin lymphomas: Secondary | ICD-10-CM | POA: Diagnosis not present

## 2022-07-27 DIAGNOSIS — Z01818 Encounter for other preprocedural examination: Secondary | ICD-10-CM

## 2022-07-27 DIAGNOSIS — N130 Hydronephrosis with ureteropelvic junction obstruction: Secondary | ICD-10-CM | POA: Diagnosis not present

## 2022-07-27 DIAGNOSIS — J45909 Unspecified asthma, uncomplicated: Secondary | ICD-10-CM | POA: Insufficient documentation

## 2022-07-27 DIAGNOSIS — R339 Retention of urine, unspecified: Secondary | ICD-10-CM | POA: Insufficient documentation

## 2022-07-27 HISTORY — PX: TRANSURETHRAL RESECTION OF PROSTATE: SHX73

## 2022-07-27 HISTORY — PX: CYSTOSCOPY W/ RETROGRADES: SHX1426

## 2022-07-27 SURGERY — CYSTOSCOPY, WITH RETROGRADE PYELOGRAM
Anesthesia: General | Site: Urethra

## 2022-07-27 MED ORDER — ACETAMINOPHEN 500 MG PO TABS
ORAL_TABLET | ORAL | Status: AC
  Filled 2022-07-27: qty 2

## 2022-07-27 MED ORDER — PHENAZOPYRIDINE HCL 200 MG PO TABS: 200.0000 mg | ORAL_TABLET | Freq: Three times a day (TID) | ORAL | 0 refills | Status: DC | PRN

## 2022-07-27 MED ORDER — DEXAMETHASONE SODIUM PHOSPHATE 10 MG/ML IJ SOLN
INTRAMUSCULAR | Status: AC
  Filled 2022-07-27: qty 1

## 2022-07-27 MED ORDER — DEXAMETHASONE SODIUM PHOSPHATE 10 MG/ML IJ SOLN
INTRAMUSCULAR | Status: DC | PRN
  Administered 2022-07-27: 4 mg via INTRAVENOUS

## 2022-07-27 MED ORDER — SODIUM CHLORIDE 0.9 % IV SOLN
1.0000 g | INTRAVENOUS | Status: DC
  Administered 2022-07-28: 1 g via INTRAVENOUS
  Filled 2022-07-27: qty 10

## 2022-07-27 MED ORDER — SODIUM CHLORIDE 0.9% FLUSH
3.0000 mL | Freq: Two times a day (BID) | INTRAVENOUS | Status: DC
  Administered 2022-07-28: 3 mL via INTRAVENOUS

## 2022-07-27 MED ORDER — SODIUM CHLORIDE 0.9 % IV SOLN: 250.0000 mL | INTRAVENOUS | Status: DC | PRN

## 2022-07-27 MED ORDER — HYOSCYAMINE SULFATE 0.125 MG SL SUBL: 0.1250 mg | SUBLINGUAL_TABLET | Freq: Four times a day (QID) | SUBLINGUAL | Status: DC | PRN

## 2022-07-27 MED ORDER — SODIUM CHLORIDE 0.9 % IR SOLN
Status: DC | PRN
  Administered 2022-07-27: 6000 mL
  Administered 2022-07-27: 3000 mL
  Administered 2022-07-27 (×2): 12000 mL

## 2022-07-27 MED ORDER — TAMSULOSIN HCL 0.4 MG PO CAPS
0.8000 mg | ORAL_CAPSULE | Freq: Every day | ORAL | Status: DC
  Administered 2022-07-27: 0.8 mg via ORAL
  Filled 2022-07-27: qty 2

## 2022-07-27 MED ORDER — ORAL CARE MOUTH RINSE: 15.0000 mL | Freq: Once | OROMUCOSAL | Status: AC

## 2022-07-27 MED ORDER — LIOTHYRONINE SODIUM POWD: 20.0000 ug | Freq: Every morning | Status: DC

## 2022-07-27 MED ORDER — SODIUM CHLORIDE 0.45 % IV SOLN: INTRAVENOUS | Status: DC

## 2022-07-27 MED ORDER — FENTANYL CITRATE (PF) 100 MCG/2ML IJ SOLN
INTRAMUSCULAR | Status: DC | PRN
  Administered 2022-07-27 (×2): 50 ug via INTRAVENOUS

## 2022-07-27 MED ORDER — NALTREXONE HCL (PAIN) 4.5 MG PO CAPS: 4.5000 mg | ORAL_CAPSULE | Freq: Every evening | ORAL | Status: DC

## 2022-07-27 MED ORDER — PROPOFOL 10 MG/ML IV BOLUS
INTRAVENOUS | Status: AC
  Filled 2022-07-27: qty 20

## 2022-07-27 MED ORDER — LIDOCAINE 2% (20 MG/ML) 5 ML SYRINGE
INTRAMUSCULAR | Status: DC | PRN
  Administered 2022-07-27: 60 mg via INTRAVENOUS

## 2022-07-27 MED ORDER — ZOLPIDEM TARTRATE 5 MG PO TABS: 5.0000 mg | ORAL_TABLET | Freq: Every evening | ORAL | Status: DC | PRN

## 2022-07-27 MED ORDER — DIPHENHYDRAMINE HCL 50 MG/ML IJ SOLN: 12.5000 mg | Freq: Four times a day (QID) | INTRAMUSCULAR | Status: DC | PRN

## 2022-07-27 MED ORDER — PROPOFOL 10 MG/ML IV BOLUS
INTRAVENOUS | Status: DC | PRN
  Administered 2022-07-27: 50 mg via INTRAVENOUS
  Administered 2022-07-27: 120 mg via INTRAVENOUS
  Administered 2022-07-27: 50 mg via INTRAVENOUS
  Administered 2022-07-27: 30 mg via INTRAVENOUS

## 2022-07-27 MED ORDER — LACTATED RINGERS IV SOLN
INTRAVENOUS | Status: DC
  Administered 2022-07-27: 1000 mL via INTRAVENOUS

## 2022-07-27 MED ORDER — HYDROCODONE-ACETAMINOPHEN 5-325 MG PO TABS: 1.0000 | ORAL_TABLET | ORAL | Status: DC | PRN

## 2022-07-27 MED ORDER — CHLORHEXIDINE GLUCONATE 0.12 % MT SOLN
15.0000 mL | Freq: Once | OROMUCOSAL | Status: AC
  Administered 2022-07-27: 15 mL via OROMUCOSAL

## 2022-07-27 MED ORDER — SODIUM CHLORIDE 0.9 % IR SOLN
3000.0000 mL | Status: DC
  Administered 2022-07-27 – 2022-07-28 (×2): 3000 mL

## 2022-07-27 MED ORDER — ONDANSETRON HCL 4 MG/2ML IJ SOLN
INTRAMUSCULAR | Status: AC
  Filled 2022-07-27: qty 2

## 2022-07-27 MED ORDER — LEVOTHYROXINE SODIUM 50 MCG PO TABS
50.0000 ug | ORAL_TABLET | Freq: Every day | ORAL | Status: DC
  Administered 2022-07-28: 50 ug via ORAL
  Filled 2022-07-27: qty 1

## 2022-07-27 MED ORDER — DOCUSATE SODIUM 100 MG PO CAPS
100.0000 mg | ORAL_CAPSULE | Freq: Two times a day (BID) | ORAL | Status: DC
  Administered 2022-07-27 – 2022-07-28 (×2): 100 mg via ORAL
  Filled 2022-07-27 (×2): qty 1

## 2022-07-27 MED ORDER — EPHEDRINE 5 MG/ML INJ
INTRAVENOUS | Status: AC
  Filled 2022-07-27: qty 10

## 2022-07-27 MED ORDER — ONDANSETRON HCL 4 MG/2ML IJ SOLN
INTRAMUSCULAR | Status: DC | PRN
  Administered 2022-07-27: 4 mg via INTRAVENOUS

## 2022-07-27 MED ORDER — EPHEDRINE SULFATE-NACL 50-0.9 MG/10ML-% IV SOSY
PREFILLED_SYRINGE | INTRAVENOUS | Status: DC | PRN
  Administered 2022-07-27 (×6): 5 mg via INTRAVENOUS

## 2022-07-27 MED ORDER — IOPAMIDOL (ISOVUE-300) INJECTION 61%
INTRAVENOUS | Status: DC | PRN
  Administered 2022-07-27: 35 mL via URETHRAL

## 2022-07-27 MED ORDER — ACETAMINOPHEN 500 MG PO TABS
1000.0000 mg | ORAL_TABLET | Freq: Once | ORAL | Status: AC
  Administered 2022-07-27: 1000 mg via ORAL

## 2022-07-27 MED ORDER — FINASTERIDE 5 MG PO TABS
5.0000 mg | ORAL_TABLET | Freq: Every day | ORAL | Status: DC
  Administered 2022-07-28: 5 mg via ORAL
  Filled 2022-07-27: qty 1

## 2022-07-27 MED ORDER — PHENYLEPHRINE 80 MCG/ML (10ML) SYRINGE FOR IV PUSH (FOR BLOOD PRESSURE SUPPORT)
PREFILLED_SYRINGE | INTRAVENOUS | Status: DC | PRN
  Administered 2022-07-27: 160 ug via INTRAVENOUS

## 2022-07-27 MED ORDER — SODIUM CHLORIDE 0.9 % IV SOLN
2.0000 g | INTRAVENOUS | Status: AC
  Administered 2022-07-27: 2 g via INTRAVENOUS
  Filled 2022-07-27: qty 20

## 2022-07-27 MED ORDER — SODIUM CHLORIDE 0.9% FLUSH: 3.0000 mL | INTRAVENOUS | Status: DC | PRN

## 2022-07-27 MED ORDER — DIPHENHYDRAMINE HCL 12.5 MG/5ML PO ELIX: 12.5000 mg | ORAL_SOLUTION | Freq: Four times a day (QID) | ORAL | Status: DC | PRN

## 2022-07-27 MED ORDER — FENTANYL CITRATE PF 50 MCG/ML IJ SOSY: 25.0000 ug | PREFILLED_SYRINGE | INTRAMUSCULAR | Status: DC | PRN

## 2022-07-27 MED ORDER — FENTANYL CITRATE (PF) 100 MCG/2ML IJ SOLN
INTRAMUSCULAR | Status: AC
  Filled 2022-07-27: qty 2

## 2022-07-27 MED ORDER — ACETAMINOPHEN 325 MG PO TABS: 650.0000 mg | ORAL_TABLET | ORAL | Status: DC | PRN

## 2022-07-27 MED ORDER — ONDANSETRON HCL 4 MG/2ML IJ SOLN: 4.0000 mg | INTRAMUSCULAR | Status: DC | PRN

## 2022-07-27 MED ORDER — LIOTHYRONINE SODIUM 5 MCG PO TABS
20.0000 ug | ORAL_TABLET | Freq: Every day | ORAL | Status: DC
  Administered 2022-07-28: 20 ug via ORAL
  Filled 2022-07-27: qty 4

## 2022-07-27 SURGICAL SUPPLY — 25 items
BAG COUNTER SPONGE SURGICOUNT (BAG) IMPLANT
BAG DRN RND TRDRP ANRFLXCHMBR (UROLOGICAL SUPPLIES) ×2
BAG SPNG CNTER NS LX DISP (BAG)
BAG URINE DRAIN 2000ML AR STRL (UROLOGICAL SUPPLIES) ×2 IMPLANT
BAG URO CATCHER STRL LF (MISCELLANEOUS) ×2 IMPLANT
CATH HEMA 3WAY 30CC 22FR COUDE (CATHETERS) IMPLANT
CATH URETL OPEN END 6FR 70 (CATHETERS) IMPLANT
CLOTH BEACON ORANGE TIMEOUT ST (SAFETY) ×2 IMPLANT
DRAPE FOOT SWITCH (DRAPES) ×2 IMPLANT
GLOVE SURG LX STRL 7.5 STRW (GLOVE) ×2 IMPLANT
GOWN STRL REUS W/ TWL XL LVL3 (GOWN DISPOSABLE) ×2 IMPLANT
GOWN STRL REUS W/TWL XL LVL3 (GOWN DISPOSABLE) ×2
GUIDEWIRE STR DUAL SENSOR (WIRE) ×2 IMPLANT
HOLDER FOLEY CATH W/STRAP (MISCELLANEOUS) IMPLANT
LOOP CUT BIPOLAR 24F LRG (ELECTROSURGICAL) IMPLANT
MANIFOLD NEPTUNE II (INSTRUMENTS) ×2 IMPLANT
NS IRRIG 1000ML POUR BTL (IV SOLUTION) IMPLANT
PACK CYSTO (CUSTOM PROCEDURE TRAY) ×2 IMPLANT
PENCIL SMOKE EVACUATOR (MISCELLANEOUS) IMPLANT
SYR 30ML LL (SYRINGE) ×2 IMPLANT
SYR TOOMEY IRRIG 70ML (MISCELLANEOUS) ×2
SYRINGE TOOMEY IRRIG 70ML (MISCELLANEOUS) ×2 IMPLANT
TUBING CONNECTING 10 (TUBING) ×2 IMPLANT
TUBING INSUFFLATION 10FT LAP (TUBING) IMPLANT
TUBING UROLOGY SET (TUBING) ×2 IMPLANT

## 2022-07-27 NOTE — Transfer of Care (Signed)
Immediate Anesthesia Transfer of Care Note  Patient: Kyle Wise  Procedure(s) Performed: CYSTOSCOPY WITH BILATERAL RETROGRADE PYELOGRAPHY (Bilateral: Ureter) TRANSURETHRAL RESECTION OF THE PROSTATE (TURP) (Urethra)  Patient Location: PACU  Anesthesia Type:General  Level of Consciousness: awake, alert , and patient cooperative  Airway & Oxygen Therapy: Patient Spontanous Breathing and Patient connected to face mask oxygen  Post-op Assessment: Report given to RN and Post -op Vital signs reviewed and stable  Post vital signs: Reviewed and stable  Last Vitals:  Vitals Value Taken Time  BP 153/80 07/27/22 1148  Temp    Pulse 81 07/27/22 1150  Resp 15 07/27/22 1150  SpO2 100 % 07/27/22 1150  Vitals shown include unvalidated device data.  Last Pain:  Vitals:   07/27/22 0852  TempSrc:   PainSc: 0-No pain         Complications: No notable events documented.

## 2022-07-27 NOTE — Anesthesia Postprocedure Evaluation (Signed)
Anesthesia Post Note  Patient: Kyle Wise  Procedure(s) Performed: CYSTOSCOPY WITH BILATERAL RETROGRADE PYELOGRAPHY (Bilateral: Ureter) TRANSURETHRAL RESECTION OF THE PROSTATE (TURP) (Urethra)     Patient location during evaluation: PACU Anesthesia Type: General Level of consciousness: awake and alert Pain management: pain level controlled Vital Signs Assessment: post-procedure vital signs reviewed and stable Respiratory status: spontaneous breathing, nonlabored ventilation, respiratory function stable and patient connected to nasal cannula oxygen Cardiovascular status: blood pressure returned to baseline and stable Postop Assessment: no apparent nausea or vomiting Anesthetic complications: no  No notable events documented.  Last Vitals:  Vitals:   07/27/22 1315 07/27/22 1345  BP: 135/76 132/80  Pulse: 64 (!) 59  Resp: 16 18  Temp: 36.5 C 36.6 C  SpO2: 98% 100%    Last Pain:  Vitals:   07/27/22 1345  TempSrc: Oral  PainSc:                  [name]

## 2022-07-27 NOTE — Anesthesia Preprocedure Evaluation (Addendum)
Anesthesia Evaluation  Patient identified by MRN, date of birth, ID band Patient awake    Reviewed: Allergy & Precautions, NPO status , Patient's Chart, lab work & pertinent test results  Airway Mallampati: III  TM Distance: >3 FB Neck ROM: Full    Dental  (+) Missing, Dental Advisory Given,    Pulmonary asthma    Pulmonary exam normal breath sounds clear to auscultation       Cardiovascular Normal cardiovascular exam Rhythm:Regular Rate:Normal  HLD   Neuro/Psych negative neurological ROS  negative psych ROS   GI/Hepatic negative GI ROS, Neg liver ROS,,,  Endo/Other  Hypothyroidism    Renal/GU Renal InsufficiencyRenal disease  negative genitourinary   Musculoskeletal  (+) Arthritis ,    Abdominal   Peds  Hematology negative hematology ROS (+) B cell lymphoma   Anesthesia Other Findings   Reproductive/Obstetrics                             Anesthesia Physical Anesthesia Plan  ASA: 3  Anesthesia Plan: General   Post-op Pain Management: Tylenol PO (pre-op)*   Induction: Intravenous  PONV Risk Score and Plan: 2 and Ondansetron, Dexamethasone and Treatment may vary due to age or medical condition  Airway Management Planned: LMA  Additional Equipment:   Intra-op Plan:   Post-operative Plan: Extubation in OR  Informed Consent: I have reviewed the patients History and Physical, chart, labs and discussed the procedure including the risks, benefits and alternatives for the proposed anesthesia with the patient or authorized representative who has indicated his/her understanding and acceptance.     Dental advisory given  Plan Discussed with: CRNA  Anesthesia Plan Comments:        Anesthesia Quick Evaluation

## 2022-07-27 NOTE — Progress Notes (Signed)
  Transition of Care [facility]) Screening Note   Patient Details  Name: Kyle Wise Date of Birth: 1940-12-17   Transition of Care [facility]) CM/SW Contact:    Dessa Phi, RN Phone Number: 07/27/2022, 1:20 PM    Transition of Care Department [facility]) has reviewed patient and no TOC needs have been identified at this time. We will continue to monitor patient advancement through interdisciplinary progression rounds. If new patient transition needs arise, please place a TOC consult.

## 2022-07-27 NOTE — Progress Notes (Signed)
Patient ID: Kyle Wise, male   DOB: September 10, 1940, 82 y.o.   MRN: [id number]   Doing well.  Urine mostly clear on minimal drip.  Continue to wean CBI off overnight.

## 2022-07-27 NOTE — Interval H&P Note (Signed)
History and Physical Interval Note:  07/27/2022 9:39 AM  Kyle Wise  has presented today for surgery, with the diagnosis of URINARY RETENTION.  The various methods of treatment have been discussed with the patient and family. After consideration of risks, benefits and other options for treatment, the patient has consented to  Procedure(s) with comments: CYSTOSCOPY WITH BILATERAL RETROGRADE PYELOGRAPHY (Bilateral) - 90 MINUTES NEEDED FOR CASE ANESTHESIA CAN BE GENERAL OR SPINAL TRANSURETHRAL RESECTION OF THE PROSTATE (TURP) (N/A) as a surgical intervention.  The patient's history has been reviewed, patient examined, no change in status, stable for surgery.  I have reviewed the patient's chart and labs.  Questions were answered to the patient's satisfaction.     [employer]

## 2022-07-27 NOTE — Op Note (Signed)
Preoperative diagnosis: Bladder outlet obstruction secondary to BPH with urinary retention Bilateral hydronephrosis/chronic kidney disease  Postoperative diagnosis:  Bladder outlet obstruction secondary to BPH with urinary retention Bilateral hydronephrosis/chronic kidney disease  Procedure:  Cystoscopy Bilateral retrograde pyelography with interpretation Bipolar transurethral resection of the prostate  Surgeon: Pryor Curia. M.D.  Anesthesia: General  Complications: None  EBL: Minimal  Intraoperative findings: Bilateral retrograde pyelography was performed with a 6 French ureteral catheter and Omnipaque contrast.  There was noted to be significant J hooking of the ureters bilaterally with proximal ureteral dilation and tortuosity in mild to moderate hydronephrosis bilaterally.  No intrinsic filling defects were noted within the renal pelvis or ureters.  The contrast was noted to clear throughout the procedure.  Specimens: Prostate chips  Disposition of specimens: Pathology   Indication: Kyle Wise is a patient with bladder outlet obstruction and urinary retention secondary to benign prostatic hyperplasia.  He also has chronic kidney disease with a baseline serum creatinine of approximately 1.6-1.7.  He was also recently found to have bilateral hydronephrosis when he was in urinary retention.  This persisted following catheter placement and it was therefore recommended that he undergo further evaluation intraoperatively today.  After reviewing the management options for treatment, he elected to proceed with the above surgical procedure(s). We have discussed the potential benefits and risks of the procedure, side effects of the proposed treatment, the likelihood of the patient achieving the goals of the procedure, and any potential problems that might occur during the procedure or recuperation. Informed consent has been obtained.  Description of procedure:  The patient was  taken to the operating room and general anesthesia was induced.  The patient was placed in the dorsal lithotomy position, prepped and draped in the usual sterile fashion, and preoperative antibiotics were administered. A preoperative time-out was performed.   Cystourethroscopy was performed.  The patient's urethra was examined and demonstrated severe bilateral lobe hypertrophy with a high bladder neck.  The left ureter was initially identified and was intubated with a 6 French ureteral catheter.  Omnipaque contrast was injected.  This demonstrated severe J hooking of the left ureter with dilation of the proximal ureter and mild to moderate left hydronephrosis.  No filling defects were noted.  An identical procedure was then performed on the contralateral side with similar findings.  As the contrast was monitored, it did gradually clear indicating that his hydronephrosis did not appear to be obstructive.  The bladder was then systematically examined in its entirety. There was no evidence of any bladder tumors, stones, or other mucosal pathology.  The ureteral orifices were identified and marked so as to be avoided during the procedure.  The prostate adenoma was then resected utilizing loop cautery resection with the bipolar cutting loop.  The prostate adenoma from the bladder neck back to the verumontanum was resected beginning at the six o'clock position and then extended to include the right and left lobes of the prostate and anterior prostate. Care was taken not to resect distal to the verumontanum.  Hemostasis was then achieved with the cautery and the bladder was emptied and reinspected with no significant bleeding noted at the end of the procedure.    A 3 way catheter was then placed into the bladder and placed on continuous bladder irrigation.  The patient appeared to tolerate the procedure well and without complications.  The patient was able to be awakened and transferred to the recovery unit in  satisfactory condition.

## 2022-07-27 NOTE — Plan of Care (Signed)
  Problem: Education: Goal: Knowledge of the prescribed therapeutic regimen will improve Outcome: Progressing   Problem: Health Behavior/Discharge Planning: Goal: Identification of resources available to assist in meeting health care needs will improve Outcome: Progressing   Problem: Skin Integrity: Goal: Demonstration of wound healing without infection will improve Outcome: Progressing   Problem: Urinary Elimination: Goal: Ability to avoid or minimize complications of infection will improve Outcome: Progressing   Problem: Education: Goal: Knowledge of General Education information will improve Description: Including pain rating scale, medication(s)/side effects and non-pharmacologic comfort measures Outcome: Progressing

## 2022-07-27 NOTE — Anesthesia Procedure Notes (Signed)
Procedure Name: LMA Insertion Date/Time: 07/27/2022 10:12 AM  Performed by: Eben Burow, CRNAPre-anesthesia Checklist: Patient identified, Emergency Drugs available, Suction available, Patient being monitored and Timeout performed Patient Re-evaluated:Patient Re-evaluated prior to induction Oxygen Delivery Method: Circle system utilized Preoxygenation: Pre-oxygenation with 100% oxygen Induction Type: IV induction Ventilation: Mask ventilation without difficulty LMA: LMA inserted LMA Size: 4.0 Number of attempts: 1 Tube secured with: Tape Dental Injury: Teeth and Oropharynx as per pre-operative assessment

## 2022-07-27 NOTE — Discharge Instructions (Signed)
You may see some blood in the urine and may have some burning with urination for 48-72 hours. You also may notice that you have to urinate more frequently or urgently after your procedure which is normal.  You should call should you develop an inability urinate, fever > 101, persistent nausea and vomiting that prevents you from eating or drinking to stay hydrated.  If you have a catheter, you will be taught how to take care of the catheter by the nursing staff prior to discharge from the hospital.  You may periodically feel a strong urge to void with the catheter in place.  This is a bladder spasm and most often can occur when having a bowel movement or moving around. It is typically self-limited and usually will stop after a few minutes.  You may use some Vaseline or Neosporin around the tip of the catheter to reduce friction at the tip of the penis. You may also see some blood in the urine.  A very small amount of blood can make the urine look quite red.  As long as the catheter is draining well, there usually is not a problem.  However, if the catheter is not draining well and is bloody, you should call the office [phone]) to notify us.

## 2022-07-28 ENCOUNTER — Encounter (HOSPITAL_COMMUNITY): Payer: Self-pay | Admitting: Urology

## 2022-07-28 DIAGNOSIS — N401 Enlarged prostate with lower urinary tract symptoms: Secondary | ICD-10-CM | POA: Diagnosis not present

## 2022-07-28 LAB — BASIC METABOLIC PANEL
Anion gap: 9 (ref 5–15)
BUN: 28 mg/dL — ABNORMAL HIGH (ref 8–23)
CO2: 25 mmol/L (ref 22–32)
Calcium: 8.5 mg/dL — ABNORMAL LOW (ref 8.9–10.3)
Chloride: 103 mmol/L (ref 98–111)
Creatinine, Ser: 1.41 mg/dL — ABNORMAL HIGH (ref 0.61–1.24)
GFR, Estimated: 49 mL/min — ABNORMAL LOW (ref >60)
Glucose, Bld: 111 mg/dL — ABNORMAL HIGH (ref 70–99)
Potassium: 3.8 mmol/L (ref 3.5–5.1)
Sodium: 137 mmol/L (ref 135–145)

## 2022-07-28 LAB — SURGICAL PATHOLOGY

## 2022-07-28 MED ORDER — PHENAZOPYRIDINE HCL 200 MG PO TABS: 200.0000 mg | ORAL_TABLET | Freq: Three times a day (TID) | ORAL | Status: DC | PRN

## 2022-07-28 MED ORDER — CHLORHEXIDINE GLUCONATE CLOTH 2 % EX PADS: 6.0000 | MEDICATED_PAD | Freq: Every day | CUTANEOUS | Status: DC

## 2022-07-28 NOTE — Plan of Care (Signed)
Problem: Health Behavior/Discharge Planning: Goal: Identification of resources available to assist in meeting health care needs will improve Outcome: Progressing   Problem: Urinary Elimination: Goal: Ability to avoid or minimize complications of infection will improve 07/28/2022 1107 by Annie Sable, RN Outcome: Progressing 07/28/2022 0814 by Annie Sable, RN Outcome: Progressing   Problem: Health Behavior/Discharge Planning: Goal: Ability to manage health-related needs will improve 07/28/2022 1107 by Annie Sable, RN Outcome: Progressing 07/28/2022 0814 by Annie Sable, RN Outcome: Progressing   Problem: Elimination: Goal: Will not experience complications related to urinary retention 07/28/2022 1107 by Annie Sable, RN Outcome: Progressing 07/28/2022 0814 by Annie Sable, RN Outcome: Progressing   Problem: Education: Goal: Knowledge of the prescribed therapeutic regimen will improve 07/28/2022 1107 by Annie Sable, RN Outcome: Adequate for Discharge 07/28/2022 0814 by Annie Sable, RN Outcome: Progressing   Problem: Bowel/Gastric: Goal: Gastrointestinal status for postoperative course will improve 07/28/2022 1107 by Annie Sable, RN Outcome: Adequate for Discharge 07/28/2022 0814 by Annie Sable, RN Outcome: Progressing   Problem: Skin Integrity: Goal: Demonstration of wound healing without infection will improve 07/28/2022 1107 by Annie Sable, RN Outcome: Adequate for Discharge 07/28/2022 0814 by Annie Sable, RN Outcome: Progressing   Problem: Education: Goal: Knowledge of General Education information will improve Description: Including pain rating scale, medication(s)/side effects and non-pharmacologic comfort measures 07/28/2022 1107 by Annie Sable, RN Outcome: Adequate for Discharge 07/28/2022 0814 by Annie Sable, RN Outcome: Progressing   Problem: Clinical Measurements: Goal: Will remain free from infection 07/28/2022 1107 by Annie Sable, RN Outcome: Adequate for Discharge 07/28/2022 0814 by Annie Sable, RN Outcome: Progressing   Problem: Activity: Goal: Risk for activity intolerance will decrease 07/28/2022 1107 by Annie Sable, RN Outcome: Adequate for Discharge 07/28/2022 0814 by Annie Sable, RN Outcome: Progressing   Problem: Nutrition: Goal: Adequate nutrition will be maintained 07/28/2022 1107 by Annie Sable, RN Outcome: Adequate for Discharge 07/28/2022 0814 by Annie Sable, RN Outcome: Progressing   Problem: Elimination: Goal: Will not experience complications related to bowel motility 07/28/2022 1107 by Annie Sable, RN Outcome: Adequate for Discharge 07/28/2022 0814 by Annie Sable, RN Outcome: Progressing   Problem: Pain Managment: Goal: General experience of comfort will improve 07/28/2022 1107 by Annie Sable, RN Outcome: Adequate for Discharge 07/28/2022 0814 by Annie Sable, RN Outcome: Progressing   Problem: Safety: Goal: Ability to remain free from injury will improve 07/28/2022 1107 by Annie Sable, RN Outcome: Adequate for Discharge 07/28/2022 0814 by Annie Sable, RN Outcome: Progressing   Problem: Skin Integrity: Goal: Risk for impaired skin integrity will decrease 07/28/2022 1107 by Annie Sable, RN Outcome: Adequate for Discharge 07/28/2022 0814 by Annie Sable, RN Outcome: Progressing   Problem: Clinical Measurements: Goal: Ability to maintain clinical measurements within normal limits will improve 07/28/2022 1107 by Annie Sable, RN Outcome: Completed/Met 07/28/2022 0814 by Annie Sable, RN Outcome: Progressing Goal: Diagnostic test results will improve 07/28/2022 1107 by Annie Sable, RN Outcome: Completed/Met 07/28/2022 0814 by Annie Sable, RN Outcome: Progressing Goal: Respiratory complications will improve 07/28/2022 1107 by Annie Sable, RN Outcome: Completed/Met 07/28/2022 0814 by Annie Sable, RN Outcome: Progressing Goal:  Cardiovascular complication will be avoided 07/28/2022 1107 by Annie Sable, RN Outcome: Completed/Met 07/28/2022 0814 by Annie Sable, RN Outcome: Progressing   Problem: Coping: Goal: Level of anxiety will decrease 07/28/2022  1107 by Annie Sable, RN Outcome: Completed/Met 07/28/2022 0814 by Annie Sable, RN Outcome: Progressing

## 2022-07-28 NOTE — Progress Notes (Signed)
Pt discharged home today per Dr. Alinda Money. Pt's IV site D/C'd and WDL. Pt's VSS. Pt and wife provided with home medication list, discharge instructions and prescriptions. Verbalized understanding. Pt left floor via WC in stable condition accompanied by NT.

## 2022-07-28 NOTE — Progress Notes (Signed)
Patient ID: Kyle Wise, male   DOB: 07/12/1940, 82 y.o.   MRN: [id number]  1 Day Post-Op Subjective: Doing well.  No complaints.  Objective: Vital signs in last 24 hours: Temp:  [97.6 F (36.4 C)-98.5 F (36.9 C)] 97.9 F (36.6 C) (03/01 0512) Pulse Rate:  [59-95] 65 (03/01 0512) Resp:  [12-20] 18 (03/01 0512) BP: (118-153)/(48-87) 120/71 (03/01 0512) SpO2:  [92 %-100 %] 97 % (03/01 0512) Weight:  [69.9 kg] 69.9 kg (02/29 0852)  Intake/Output from previous day: 02/29 0701 - 03/01 0700 In: 5088.3 [P.O.:360; I.V.:1928.3; IV Piggyback:100] Out: 16000 [Urine:15900; Blood:100] Intake/Output this shift: No intake/output data recorded.  Physical Exam:  General: Alert and oriented GU: Urine pink tinged with CBI off  Lab Results: BMET Recent Labs    07/28/22 0538  NA 137  K 3.8  CL 103  CO2 25  GLUCOSE 111*  BUN 28*  CREATININE 1.41*  CALCIUM 8.5*     Studies/Results:   Assessment/Plan: POD # 1 s/p TURP - Voiding trial this morning (catheter has been removed) - Will reassess later today for discharge   LOS: 0 days   Kyle Wise 07/28/2022, 7:27 AM

## 2022-07-28 NOTE — Discharge Summary (Signed)
Date of admission: 07/27/2022  Date of discharge: 07/28/2022  Admission diagnosis: Urinary retention secondary to BPH  Discharge diagnosis: Urinary retention secondary to BPH  Secondary diagnoses: Hypothyroidism  History and Physical: For full details, please see admission history and physical. Briefly, Kyle Wise is a 82 y.o. year old patient with urinary retention secondary to BPH.   Hospital Course: He was taken the operating room on 07/27/2022 and underwent a transurethral resection of the prostate.  In addition, bilateral retrograde pyelography was performed due to hydronephrosis which appear to be secondary to his BPH but was nonobstructive.  He was maintained on continuous bladder irrigation overnight and his catheter was able to be removed the following morning.  He underwent a voiding trial and initially had high postvoid residual urines at 350 cc.  He continued with observation and voided multiple times with his postvoid residual urine decreasing to 150 cc.  He was therefore discharged home in stable condition.  Laboratory values: No results for input(s): "HGB", "HCT" in the last 72 hours. Recent Labs    07/28/22 0538  CREATININE 1.41*    Disposition: Home  Discharge instruction: The patient was instructed to be ambulatory but told to refrain from heavy lifting, strenuous activity, or driving.   Discharge medications:  Allergies as of 07/28/2022   No Known Allergies      Medication List     TAKE these medications    doxycycline 100 MG tablet Commonly known as: VIBRA-TABS Take 100 mg by mouth 2 (two) times daily.   Euthyrox 50 MCG tablet Generic drug: levothyroxine TAKE 1 TABLET EVERY DAY   finasteride 5 MG tablet Commonly known as: PROSCAR Take 5 mg by mouth in the morning.   Liothyronine Sodium Powd Take 20 mcg by mouth in the morning. T3 SR (liothyronine) 20 mcg   multivitamin with minerals Tabs tablet Take 1 tablet by mouth daily.   Naltrexone HCl (Pain)  4.5 MG Caps Take 4.5 mg by mouth every evening.   phenazopyridine 200 MG tablet Commonly known as: Pyridium Take 1 tablet (200 mg total) by mouth 3 (three) times daily as needed for pain.   tamsulosin 0.4 MG Caps capsule Commonly known as: FLOMAX Take 0.8 mg by mouth at bedtime.        Followup:   Follow-up Information     Raynelle Bring, MD Follow up.   Specialty: Urology Why: 08/16/22 at 12:30 PM Contact information: [address] [place] [address] [phone]

## 2022-07-28 NOTE — Plan of Care (Signed)
Problem: Education: Goal: Knowledge of the prescribed therapeutic regimen will improve 07/28/2022 1734 by Annie Sable, RN Outcome: Completed/Met 07/28/2022 1107 by Annie Sable, RN Outcome: Adequate for Discharge 07/28/2022 0814 by Annie Sable, RN Outcome: Progressing   Problem: Bowel/Gastric: Goal: Gastrointestinal status for postoperative course will improve 07/28/2022 1734 by Annie Sable, RN Outcome: Completed/Met 07/28/2022 1107 by Annie Sable, RN Outcome: Adequate for Discharge 07/28/2022 0814 by Annie Sable, RN Outcome: Progressing   Problem: Health Behavior/Discharge Planning: Goal: Identification of resources available to assist in meeting health care needs will improve 07/28/2022 1734 by Annie Sable, RN Outcome: Completed/Met 07/28/2022 0814 by Annie Sable, RN Outcome: Progressing   Problem: Skin Integrity: Goal: Demonstration of wound healing without infection will improve 07/28/2022 1734 by Annie Sable, RN Outcome: Completed/Met 07/28/2022 1107 by Annie Sable, RN Outcome: Adequate for Discharge 07/28/2022 0814 by Annie Sable, RN Outcome: Progressing   Problem: Urinary Elimination: Goal: Ability to avoid or minimize complications of infection will improve 07/28/2022 1734 by Annie Sable, RN Outcome: Completed/Met 07/28/2022 1107 by Annie Sable, RN Outcome: Progressing 07/28/2022 0814 by Annie Sable, RN Outcome: Progressing   Problem: Education: Goal: Knowledge of General Education information will improve Description: Including pain rating scale, medication(s)/side effects and non-pharmacologic comfort measures 07/28/2022 1734 by Annie Sable, RN Outcome: Completed/Met 07/28/2022 1107 by Annie Sable, RN Outcome: Adequate for Discharge 07/28/2022 0814 by Annie Sable, RN Outcome: Progressing   Problem: Health Behavior/Discharge Planning: Goal: Ability to manage health-related needs will improve 07/28/2022 1734 by Annie Sable, RN Outcome: Completed/Met 07/28/2022 1107 by Annie Sable, RN Outcome: Progressing 07/28/2022 0814 by Annie Sable, RN Outcome: Progressing   Problem: Clinical Measurements: Goal: Ability to maintain clinical measurements within normal limits will improve 07/28/2022 1107 by Annie Sable, RN Outcome: Completed/Met 07/28/2022 0814 by Annie Sable, RN Outcome: Progressing Goal: Will remain free from infection 07/28/2022 1734 by Annie Sable, RN Outcome: Completed/Met 07/28/2022 1107 by Annie Sable, RN Outcome: Adequate for Discharge 07/28/2022 0814 by Annie Sable, RN Outcome: Progressing Goal: Diagnostic test results will improve 07/28/2022 1107 by Annie Sable, RN Outcome: Completed/Met 07/28/2022 0814 by Annie Sable, RN Outcome: Progressing Goal: Respiratory complications will improve 07/28/2022 1107 by Annie Sable, RN Outcome: Completed/Met 07/28/2022 0814 by Annie Sable, RN Outcome: Progressing Goal: Cardiovascular complication will be avoided 07/28/2022 1107 by Annie Sable, RN Outcome: Completed/Met 07/28/2022 0814 by Annie Sable, RN Outcome: Progressing   Problem: Activity: Goal: Risk for activity intolerance will decrease 07/28/2022 1734 by Annie Sable, RN Outcome: Completed/Met 07/28/2022 1107 by Annie Sable, RN Outcome: Adequate for Discharge 07/28/2022 0814 by Annie Sable, RN Outcome: Progressing   Problem: Nutrition: Goal: Adequate nutrition will be maintained 07/28/2022 1734 by Annie Sable, RN Outcome: Completed/Met 07/28/2022 1107 by Annie Sable, RN Outcome: Adequate for Discharge 07/28/2022 0814 by Annie Sable, RN Outcome: Progressing   Problem: Coping: Goal: Level of anxiety will decrease 07/28/2022 1107 by Annie Sable, RN Outcome: Completed/Met 07/28/2022 0814 by Annie Sable, RN Outcome: Progressing   Problem: Elimination: Goal: Will not experience complications related to bowel  motility 07/28/2022 1734 by Annie Sable, RN Outcome: Completed/Met 07/28/2022 1107 by Annie Sable, RN Outcome: Adequate for Discharge 07/28/2022 0814 by Annie Sable, RN Outcome: Progressing Goal: Will not experience complications related to urinary retention 07/28/2022 1734  by Annie Sable, RN Outcome: Completed/Met 07/28/2022 1107 by Annie Sable, RN Outcome: Progressing 07/28/2022 0814 by Annie Sable, RN Outcome: Progressing   Problem: Pain Managment: Goal: General experience of comfort will improve 07/28/2022 1734 by Annie Sable, RN Outcome: Completed/Met 07/28/2022 1107 by Annie Sable, RN Outcome: Adequate for Discharge 07/28/2022 0814 by Annie Sable, RN Outcome: Progressing   Problem: Safety: Goal: Ability to remain free from injury will improve 07/28/2022 1734 by Annie Sable, RN Outcome: Completed/Met 07/28/2022 1107 by Annie Sable, RN Outcome: Adequate for Discharge 07/28/2022 0814 by Annie Sable, RN Outcome: Progressing   Problem: Skin Integrity: Goal: Risk for impaired skin integrity will decrease 07/28/2022 1734 by Annie Sable, RN Outcome: Completed/Met 07/28/2022 1107 by Annie Sable, RN Outcome: Adequate for Discharge 07/28/2022 0814 by Annie Sable, RN Outcome: Progressing

## 2022-07-28 NOTE — Plan of Care (Signed)
  Problem: Education: Goal: Knowledge of the prescribed therapeutic regimen will improve Outcome: Progressing   Problem: Bowel/Gastric: Goal: Gastrointestinal status for postoperative course will improve Outcome: Progressing   Problem: Health Behavior/Discharge Planning: Goal: Identification of resources available to assist in meeting health care needs will improve Outcome: Progressing   Problem: Skin Integrity: Goal: Demonstration of wound healing without infection will improve Outcome: Progressing   Problem: Urinary Elimination: Goal: Ability to avoid or minimize complications of infection will improve Outcome: Progressing   Problem: Education: Goal: Knowledge of General Education information will improve Description: Including pain rating scale, medication(s)/side effects and non-pharmacologic comfort measures Outcome: Progressing   Problem: Health Behavior/Discharge Planning: Goal: Ability to manage health-related needs will improve Outcome: Progressing   Problem: Clinical Measurements: Goal: Ability to maintain clinical measurements within normal limits will improve Outcome: Progressing Goal: Will remain free from infection Outcome: Progressing Goal: Diagnostic test results will improve Outcome: Progressing Goal: Respiratory complications will improve Outcome: Progressing Goal: Cardiovascular complication will be avoided Outcome: Progressing   Problem: Activity: Goal: Risk for activity intolerance will decrease Outcome: Progressing   Problem: Nutrition: Goal: Adequate nutrition will be maintained Outcome: Progressing   Problem: Coping: Goal: Level of anxiety will decrease Outcome: Progressing   Problem: Elimination: Goal: Will not experience complications related to bowel motility Outcome: Progressing Goal: Will not experience complications related to urinary retention Outcome: Progressing   Problem: Pain Managment: Goal: General experience of comfort  will improve Outcome: Progressing   Problem: Safety: Goal: Ability to remain free from injury will improve Outcome: Progressing   Problem: Skin Integrity: Goal: Risk for impaired skin integrity will decrease Outcome: Progressing

## 2022-07-31 ENCOUNTER — Telehealth: Payer: Self-pay

## 2022-07-31 NOTE — Transitions of Care (Post Inpatient/ED Visit) (Signed)
   07/31/2022  Name: ADELL ESHBACH MRN: [id number] DOB: 1940-12-05  Today's TOC FU Call Status: Today's TOC FU Call Status:: Unsuccessul Call (1st Attempt) Unsuccessful Call (1st Attempt) Date: 07/31/22  Attempted to reach the patient regarding the most recent Inpatient/ED visit.  Follow Up Plan: Additional outreach attempts will be made to reach the patient to complete the Transitions of Care (Post Inpatient/ED visit) call.   Signature Juanda Crumble, [place] Direct Dial [phone]

## 2022-08-01 NOTE — Transitions of Care (Post Inpatient/ED Visit) (Signed)
   08/01/2022  Name: Kyle Wise MRN: [id number] DOB: 14-Jan-1941  Today's TOC FU Call Status: Today's TOC FU Call Status:: Successful TOC FU Call Competed Unsuccessful Call (1st Attempt) Date: 07/31/22 [facility] FU Call Complete Date: 08/01/22  Transition Care Management Follow-up Telephone Call Date of Discharge: 07/28/22 Discharge [facility]: [facility]) Type of Discharge: Inpatient Admission Primary Inpatient Discharge Diagnosis:: urinary retention How have you been since you were released from the hospital?: Better Any questions or concerns?: No  Items Reviewed: Did you receive and understand the discharge instructions provided?: Yes Medications obtained and verified?: Yes (Medications Reviewed) Any new allergies since your discharge?: No Dietary orders reviewed?: Yes Do you have support at home?: Yes People in Home: spouse  Home Care and Equipment/Supplies: [place] Ordered?: NA Any new equipment or medical supplies ordered?: NA  Functional Questionnaire: Do you need assistance with bathing/showering or dressing?: No Do you need assistance with meal preparation?: No Do you need assistance with eating?: No Do you have difficulty maintaining continence: No Do you need assistance with getting out of bed/getting out of a chair/moving?: No Do you have difficulty managing or taking your medications?: No  Folllow up appointments reviewed: PCP Follow-up appointment confirmed?: NA Specialist Hospital Follow-up appointment confirmed?: Yes Date of Specialist follow-up appointment?: 08/16/22 Follow-Up Specialty Provider:: Dr Alinda Money Do you need transportation to your follow-up appointment?: No Do you understand care options if your condition(s) worsen?: Yes-patient verbalized understanding    [place], [place] Nurse Health Advisor Direct Dial [phone]

## 2022-08-28 ENCOUNTER — Ambulatory Visit: Payer: Medicare HMO | Admitting: Internal Medicine

## 2022-12-29 ENCOUNTER — Encounter: Payer: Self-pay | Admitting: Internal Medicine

## 2023-01-01 ENCOUNTER — Ambulatory Visit (INDEPENDENT_AMBULATORY_CARE_PROVIDER_SITE_OTHER): Payer: Medicare Other | Admitting: Internal Medicine

## 2023-01-01 ENCOUNTER — Encounter: Payer: Self-pay | Admitting: Internal Medicine

## 2023-01-01 VITALS — BP 120/60 | HR 47 | Temp 97.6°F | Resp 12 | Ht 66.5 in | Wt 152.4 lb

## 2023-01-01 DIAGNOSIS — R739 Hyperglycemia, unspecified: Secondary | ICD-10-CM | POA: Diagnosis not present

## 2023-01-01 DIAGNOSIS — Z0001 Encounter for general adult medical examination with abnormal findings: Secondary | ICD-10-CM

## 2023-01-01 DIAGNOSIS — Z23 Encounter for immunization: Secondary | ICD-10-CM

## 2023-01-01 DIAGNOSIS — E039 Hypothyroidism, unspecified: Secondary | ICD-10-CM

## 2023-01-01 DIAGNOSIS — Z0000 Encounter for general adult medical examination without abnormal findings: Secondary | ICD-10-CM

## 2023-01-01 DIAGNOSIS — N400 Enlarged prostate without lower urinary tract symptoms: Secondary | ICD-10-CM

## 2023-01-01 DIAGNOSIS — E7800 Pure hypercholesterolemia, unspecified: Secondary | ICD-10-CM | POA: Diagnosis not present

## 2023-01-01 DIAGNOSIS — R7989 Other specified abnormal findings of blood chemistry: Secondary | ICD-10-CM | POA: Diagnosis not present

## 2023-01-01 DIAGNOSIS — R972 Elevated prostate specific antigen [PSA]: Secondary | ICD-10-CM

## 2023-01-01 LAB — LIPID PANEL
Cholesterol: 195 mg/dL (ref 0–200)
HDL: 47.7 mg/dL (ref >39.00)
LDL Cholesterol: 128 mg/dL — ABNORMAL HIGH (ref 0–99)
NonHDL: 147.21
Total CHOL/HDL Ratio: 4
Triglycerides: 95 mg/dL (ref 0.0–149.0)
VLDL: 19 mg/dL (ref 0.0–40.0)

## 2023-01-01 LAB — BASIC METABOLIC PANEL
BUN: 26 mg/dL — ABNORMAL HIGH (ref 6–23)
CO2: 31 mEq/L (ref 19–32)
Calcium: 9.4 mg/dL (ref 8.4–10.5)
Chloride: 100 mEq/L (ref 96–112)
Creatinine, Ser: 1.63 mg/dL — ABNORMAL HIGH (ref 0.40–1.50)
GFR: 38.66 mL/min — ABNORMAL LOW (ref >60.00)
Glucose, Bld: 98 mg/dL (ref 70–99)
Potassium: 4.5 mEq/L (ref 3.5–5.1)
Sodium: 141 mEq/L (ref 135–145)

## 2023-01-01 LAB — TSH: TSH: 5.64 u[IU]/mL — ABNORMAL HIGH (ref 0.35–5.50)

## 2023-01-01 LAB — AST: AST: 21 U/L (ref 0–37)

## 2023-01-01 LAB — HEMOGLOBIN A1C: Hgb A1c MFr Bld: 6 % (ref 4.6–6.5)

## 2023-01-01 LAB — ALT: ALT: 20 U/L (ref 0–53)

## 2023-01-01 NOTE — Assessment & Plan Note (Signed)
Here for CPX Td 2020 PNM 23: 2020; PNM 13: 2018.  PNM 20 today S/p shingrix Vaccines I recommend: RSV,   COVID booster, flu shot (fall). CCS: Colonoscopy 09/01/2015: + Polyps. Cscope 08/22/2019: Normal.  No evidence of AVMs, mass, colitis.  + Small hemorrhoids.Next 5 years per colonoscopy report. Prostate Ca screening : Per urology.  Recent TURP 06-2022. Recent labs reviewed, will get BMP  AST ALT FLP A1c TSH

## 2023-01-01 NOTE — Progress Notes (Signed)
Subjective:    Patient ID: Kyle Wise, male    DOB: 1941-01-09, 82 y.o.   MRN: [id number]  DOS:  01/01/2023 Type of visit - description: CPX  Here for CPX. Denies any major concerns. Urological problems: Much improved.  Specifically denies fever chills No chest pain or difficulty breathing No nausea vomiting no blood in the stools.   Review of Systems  Other than above, a 14 point review of systems is negative     Past Medical History:  Diagnosis Date   Allergy    seasonal   Anemia    Arthritis    Frequent UTI    History of colon polyps    Hyperlipidemia    Hypothyroidism    Left knee DJD    Legal blindness    Low grade B-cell lymphoma (HCC) 2005   low grade b-cell lymphoma   Osteoporosis     Past Surgical History:  Procedure Laterality Date   APPENDECTOMY  1999   BRAIN SURGERY  12/2008   meningioma    COLON SURGERY  1999   colon resection   CYSTOSCOPY W/ RETROGRADES Bilateral 07/27/2022   Procedure: CYSTOSCOPY WITH BILATERAL RETROGRADE PYELOGRAPHY;  Surgeon: Heloise Purpura, MD;  Location: [facility];  Service: Urology;  Laterality: Bilateral;  90 MINUTES NEEDED FOR CASE ANESTHESIA CAN BE GENERAL OR SPINAL   EYE SURGERY  1982   cataract and 1988   KNEE ARTHROSCOPY Left 05/2009   PARTIAL KNEE ARTHROPLASTY Left 02/03/2020   Procedure: UNICOMPARTMENTAL KNEE;  Surgeon: Teryl Lucy, MD;  Location: [facility];  Service: Orthopedics;  Laterality: Left;  combo anestesia of adductor canal block and spinal   PROSTATE ABLATION  2009   "microwave procedure", Dr Jeralyn Bennett   TONSILLECTOMY AND ADENOIDECTOMY  1945   TRANSURETHRAL RESECTION OF PROSTATE N/A 07/27/2022   Procedure: TRANSURETHRAL RESECTION OF THE PROSTATE (TURP);  Surgeon: Heloise Purpura, MD;  Location: [facility];  Service: Urology;  Laterality: N/A;   Social History   Socioeconomic History   Marital status: Married    Spouse name: Not on file   Number of children: 2   Years of education: Not on  file   Highest education level: Not on file  Occupational History   Occupation: retiredClinical cytogeneticist for [employer]  Tobacco Use   Smoking status: Never   Smokeless tobacco: Never  Vaping Use   Vaping status: Never Used  Substance and Sexual Activity   Alcohol use: Yes    Comment: socially   Drug use: Never   Sexual activity: Not on file  Other Topics Concern   Not on file  Social History Narrative   Household: Pt and wife   Social Determinants of Health   Financial Resource Strain: Low Risk  (04/04/2021)   Overall Financial Resource Strain (CARDIA)    Difficulty of Paying Living Expenses: Not hard at all  Food Insecurity: Low Risk  (10/09/2022)   Received from [facility]   Food vital sign    Within the past 12 months, you worried that your food would run out before you got money to buy more: Never true    Within the past 12 months, the food you bought just didn't last and you didn't have money to get more. : Never true  Transportation Needs: Not on file (10/09/2022)  Physical Activity: Sufficiently Active (04/04/2021)   Exercise Vital Sign    Days of Exercise per Week: 5 days    Minutes of Exercise per Session: 60 min  Stress: No Stress Concern Present (04/04/2021)   [employer] of Occupational Health - Occupational Stress Questionnaire    Feeling of Stress : Not at all  Social Connections: Moderately Integrated (04/04/2021)   Social Connection and Isolation Panel [NHANES]    Frequency of Communication with Friends and Family: More than three times a week    Frequency of Social Gatherings with Friends and Family: More than three times a week    Attends Religious Services: Never    Database administrator or Organizations: Yes    Attends Engineer, structural: More than 4 times per year    Marital Status: Married  Catering manager Violence: Not At Risk (07/27/2022)   Humiliation, Afraid, Rape, and Kick questionnaire    Fear of Current or Ex-Partner: No    Emotionally  Abused: No    Physically Abused: No    Sexually Abused: No    Current Outpatient Medications  Medication Instructions   finasteride (PROSCAR) 5 mg, Oral, Every morning   Multiple Vitamin (MULTIVITAMIN WITH MINERALS) TABS tablet 1 tablet, Oral, Daily   Naltrexone HCl (Pain) 4.5 mg, Oral, Every evening   Thyroid (LEVOTHYROXINE-LIOTHYRONINE PO) 30 mg, Oral, Daily       Objective:   Physical Exam BP 120/60 (BP Location: Right Arm, Cuff Size: Normal)   Pulse (!) 47   Temp 97.6 F (36.4 C) (Oral)   Resp 12   Ht 5' 6.5" (1.689 m)   Wt 152 lb 6.4 oz (69.1 kg)   SpO2 95%   BMI 24.23 kg/m  General: Well developed, NAD, BMI noted Neck: No  thyromegaly  HEENT:  Normocephalic . Face symmetric, atraumatic Lungs:  CTA B Normal respiratory effort, no intercostal retractions, no accessory muscle use. Heart: Tachycardic, heart rate 48 when I saw him.  No murmur. Abdomen:  Not distended, soft, non-tender. No rebound or rigidity.   Lower extremities: no pretibial edema bilaterally  Skin: Exposed areas without rash. Not pale. Not jaundice Neurologic:  alert & oriented X3.  Speech normal, gait appropriate for age and unassisted Strength symmetric and appropriate for age.  Psych: Cognition and judgment appear intact.  Cooperative with normal attention span and concentration.  Behavior appropriate. No anxious or depressed appearing.     Assessment    ASSESSMENT  Hyperglycemia High cholesterol Thyroid disease (Used to see [facility] NP, h/o iatrogenic hyperthyroidism, sees ENDO now) Asthma DJD, history of knee arthroscopy 2011 Low-grade B-cell lymphoma, hematology [facility] GU: Elevated PSA, BPH, LUTS, ureteral stricture --TUMT September 2006 Dr. Merry Lofty --Recurrent UTIs due to Citrobacter despite appropriate antibiotics. Not Rx rec for asx bacteriuria --Elevated PSA, range between 3 and 7, prostate biopsy 2005, + basal cell hyperplasia without malignancy -- Prostate MRI 08/2016 with no  evidence of high-grade prostate cancer. Dr Laverle Patter -- B hydronephrosis, s/p pyelogram retrograde, TURP 06-2022.  Persistent hydronephrosis noted, felt to be chronic. Legally blind due to meningioma surgery Colon polyp: s/p partial colectomy 1999, Dr Loman Chroman H/o skin cancer , sees derm  PLAN: Here for CPX Td 2020 PNM 23: 2020; PNM 13: 2018.  PNM 20 today S/p shingrix Vaccines I recommend: RSV,   COVID booster, flu shot (fall). CCS: Colonoscopy 09/01/2015: + Polyps. Cscope 08/22/2019: Normal.  No evidence of AVMs, mass, colitis.  + Small hemorrhoids.Next 5 years per colonoscopy report. Prostate Ca screening : Per urology.  Recent TURP 06-2022. Recent labs reviewed, will get BMP  AST ALT FLP A1c TSH  BPH, increased PSA, urinary retention: Recent urinary retention, had  indwelling catheter for 3 months, eventually had a TURP 06-2022, doing better; no catheter Hyperglycemia: Diet controlled, check A1c High cholesterol: Diet controlled, check FLP Thyroid disease: Seen elsewhere, he is bradycardic, will check a TSH. Lymphoma: Per oncology Bradycardia: Asymptomatic, has been a chronic issue, checking a TSH RTC 6 months

## 2023-01-01 NOTE — Patient Instructions (Addendum)
Vaccines I recommend: RSV COVID booster Flu shot (fall)   Check the  blood pressure regularly BP GOAL is between 110/65 and  135/85. If it is consistently higher or lower, let me know   Also check your heart rate: As long as he is 50 and above and you are not feeling weak or dizzy is okay.  GO TO THE LAB : Get the blood work     GO TO THE FRONT DESK, PLEASE SCHEDULE YOUR APPOINTMENTS Come back for   a checkup in 6 months

## 2023-01-01 NOTE — Assessment & Plan Note (Signed)
Here for CPX BPH, increased PSA, urinary retention: Recent urinary retention, had indwelling catheter for 3 months, eventually had a TURP 06-2022, doing better; no catheter Hyperglycemia: Diet controlled, check A1c High cholesterol: Diet controlled, check FLP Thyroid disease: Seen elsewhere, he is bradycardic, will check a TSH. Lymphoma: Per oncology Bradycardia: Asymptomatic, has been a chronic issue, checking a TSH RTC 6 months

## 2023-04-23 ENCOUNTER — Telehealth: Payer: Self-pay | Admitting: Internal Medicine

## 2023-04-23 NOTE — Telephone Encounter (Signed)
[facility] NP) called to report the following abnormal finding:  PVD Screening - Right Leg - Mild Result

## 2023-04-24 NOTE — Telephone Encounter (Signed)
Will discuss on RTC

## 2023-05-01 LAB — BASIC METABOLIC PANEL
BUN: 25 — AB (ref 4–21)
CO2: 24 — AB (ref 13–22)
Chloride: 102 (ref 99–108)
Creatinine: 1.5 — AB (ref 0.6–1.3)
Glucose: 91
Potassium: 4.6 meq/L (ref 3.5–5.1)
Sodium: 141 (ref 137–147)

## 2023-05-01 LAB — COMPREHENSIVE METABOLIC PANEL
Albumin: 4.5 (ref 3.5–5.0)
Calcium: 9.4 (ref 8.7–10.7)
Globulin: 2.5
eGFR: 46

## 2023-05-01 LAB — TESTOSTERONE: Testosterone: 789

## 2023-05-01 LAB — VITAMIN B12: Vitamin B-12: 826

## 2023-05-01 LAB — CBC AND DIFFERENTIAL
HCT: 50 (ref 41–53)
Hemoglobin: 16 (ref 13.5–17.5)
Neutrophils Absolute: 5
Platelets: 178 10*3/uL (ref 150–400)
WBC: 6.8

## 2023-05-01 LAB — HEMOGLOBIN A1C: Hemoglobin A1C: 5.9

## 2023-05-01 LAB — PSA: PSA: 1.6

## 2023-05-01 LAB — TSH: TSH: 6.55 — AB (ref 0.41–5.90)

## 2023-05-01 LAB — IRON,TIBC AND FERRITIN PANEL
%SAT: 43
Ferritin: 68
Iron: 159
TIBC: 372
UIBC: 213

## 2023-05-01 LAB — CBC: RBC: 5.85 — AB (ref 3.87–5.11)

## 2023-05-01 LAB — LIPID PANEL
Cholesterol: 221 — AB (ref 0–200)
HDL: 48 (ref 35–70)
LDL Cholesterol: 152
Triglycerides: 115 (ref 40–160)

## 2023-05-01 LAB — POCT ERYTHROCYTE SEDIMENTATION RATE, NON-AUTOMATED: Sed Rate: 2

## 2023-05-01 LAB — VITAMIN D 25 HYDROXY (VIT D DEFICIENCY, FRACTURES): Vit D, 25-Hydroxy: 41.9

## 2023-06-29 ENCOUNTER — Telehealth: Payer: Self-pay | Admitting: Internal Medicine

## 2023-06-29 NOTE — Telephone Encounter (Signed)
Pt dropped off copy of lab results for provider to see and have on pt's chart. Document put at front office tray under providers name.

## 2023-06-29 NOTE — Telephone Encounter (Signed)
Received, labs abstracted ahead of appt w/ PCP Monday.

## 2023-07-02 ENCOUNTER — Encounter: Payer: Self-pay | Admitting: Internal Medicine

## 2023-07-02 ENCOUNTER — Ambulatory Visit: Payer: Medicare Other | Admitting: Internal Medicine

## 2023-07-02 VITALS — BP 122/66 | HR 50 | Temp 98.0°F | Resp 16 | Ht 66.5 in | Wt 157.1 lb

## 2023-07-02 DIAGNOSIS — R972 Elevated prostate specific antigen [PSA]: Secondary | ICD-10-CM

## 2023-07-02 DIAGNOSIS — J4520 Mild intermittent asthma, uncomplicated: Secondary | ICD-10-CM

## 2023-07-02 DIAGNOSIS — E079 Disorder of thyroid, unspecified: Secondary | ICD-10-CM | POA: Diagnosis not present

## 2023-07-02 DIAGNOSIS — R739 Hyperglycemia, unspecified: Secondary | ICD-10-CM

## 2023-07-02 DIAGNOSIS — N400 Enlarged prostate without lower urinary tract symptoms: Secondary | ICD-10-CM | POA: Diagnosis not present

## 2023-07-02 NOTE — Patient Instructions (Addendum)
  Next visit with me  in 6 months     Please schedule it at the front desk

## 2023-07-02 NOTE — Assessment & Plan Note (Signed)
Labs from December 2024 reviewed. Of note, vitamin D was normal, PSA was 1.6.  TSH was 6.5.  Testosterone in the high side of normal, patient reports he is not taking testosterone supplements Hyperglycemia: Diet controlled, last A1c 5.9. Increased cholesterol: LDL gradually increasing, recommend a healthy diet.  At this age benefits of statins are relatively modest.  Nevertheless medications discussed with the patient.  He is not inclined to take any chol medication. BPH, LUTS last available urology noted 07/2022 was recommended to continue finasteride and consider stop tamsulosin; currently on finasteride only Thyroid disease: Follow-up elsewhere, [facility] NP, encouraged to check his thyroid every 6 weeks until WNL. CKD: rec to avoid NSAIDs. Lymphoma: Per oncology, last seen 04/18/2023 Preventive care: Rec s/p recent RSV,  flu shot, COVID booster   RTC 6 months

## 2023-07-02 NOTE — Progress Notes (Signed)
Subjective:    Patient ID: Kyle Wise, male    DOB: 04-01-1941, 83 y.o.   MRN: [id number]  DOS:  07/02/2023 Type of visit - description: Follow-up Since the last office visit is doing well. Chart reviewed. LUTS: Essentially resolved.   Review of Systems See above   Past Medical History:  Diagnosis Date   Allergy    seasonal   Anemia    Arthritis    Frequent UTI    History of colon polyps    Hyperlipidemia    Hypothyroidism    Left knee DJD    Legal blindness    Low grade B-cell lymphoma (HCC) 2005   low grade b-cell lymphoma   Osteoporosis     Past Surgical History:  Procedure Laterality Date   APPENDECTOMY  1999   BRAIN SURGERY  12/2008   meningioma    COLON SURGERY  1999   colon resection   CYSTOSCOPY W/ RETROGRADES Bilateral 07/27/2022   Procedure: CYSTOSCOPY WITH BILATERAL RETROGRADE PYELOGRAPHY;  Surgeon: Heloise Purpura, MD;  Location: [facility];  Service: Urology;  Laterality: Bilateral;  90 MINUTES NEEDED FOR CASE ANESTHESIA CAN BE GENERAL OR SPINAL   EYE SURGERY  1982   cataract and 1988   KNEE ARTHROSCOPY Left 05/2009   PARTIAL KNEE ARTHROPLASTY Left 02/03/2020   Procedure: UNICOMPARTMENTAL KNEE;  Surgeon: Teryl Lucy, MD;  Location: [facility];  Service: Orthopedics;  Laterality: Left;  combo anestesia of adductor canal block and spinal   PROSTATE ABLATION  2009   "microwave procedure", Dr Jeralyn Bennett   TONSILLECTOMY AND ADENOIDECTOMY  1945   TRANSURETHRAL RESECTION OF PROSTATE N/A 07/27/2022   Procedure: TRANSURETHRAL RESECTION OF THE PROSTATE (TURP);  Surgeon: Heloise Purpura, MD;  Location: [facility];  Service: Urology;  Laterality: N/A;    Current Outpatient Medications  Medication Instructions   finasteride (PROSCAR) 5 mg, Every morning   levothyroxine (EUTHYROX) 50 mcg, Daily before breakfast   LIOTRIX, T3-T4, PO    Multiple Vitamin (MULTIVITAMIN WITH MINERALS) TABS tablet 1 tablet, Daily   Naltrexone HCl (Pain) 4.5 mg, Every evening        Objective:   Physical Exam BP 122/66   Pulse (!) 50   Temp 98 F (36.7 C) (Oral)   Resp 16   Ht 5' 6.5" (1.689 m)   Wt 157 lb 2 oz (71.3 kg)   SpO2 98%   BMI 24.98 kg/m  General:   Well developed, NAD, BMI noted. HEENT:  Normocephalic . Face symmetric, atraumatic Lungs:  CTA B Normal respiratory effort, no intercostal retractions, no accessory muscle use. Heart: RRR,  no murmur.  Lower extremities: no pretibial edema bilaterally  Skin: Not pale. Not jaundice Neurologic:  alert & oriented X3.  Speech normal, gait appropriate for age and unassisted Psych--  Cognition and judgment appear intact.  Cooperative with normal attention span and concentration.  Behavior appropriate. No anxious or depressed appearing.      Assessment     ASSESSMENT  Hyperglycemia High cholesterol Thyroid disease (Used to see [facility] NP, h/o iatrogenic hyperthyroidism ) Asthma DJD, history of knee arthroscopy 2011 Low-grade B-cell lymphoma, hematology [facility] GU: Elevated PSA, BPH, LUTS, ureteral stricture --TUMT September 2006 Dr. Merry Lofty --Recurrent UTIs due to Citrobacter despite appropriate antibiotics. Not Rx rec for asx bacteriuria --Elevated PSA, range between 3 and 7, prostate biopsy 2005, + basal cell hyperplasia without malignancy -- Prostate MRI 08/2016 with no evidence of high-grade prostate cancer. Dr Laverle Patter -- B hydronephrosis, s/p pyelogram  retrograde, TURP 06-2022.  Persistent hydronephrosis noted, felt to be chronic. CKD, GFR ~40  Legally blind due to meningioma surgery Colon polyp: s/p partial colectomy 1999, Dr Loman Chroman H/o skin cancer , sees derm  PLAN: Labs from December 2024 reviewed. Of note, vitamin D was normal, PSA was 1.6.  TSH was 6.5.  Testosterone in the high side of normal, patient reports he is not taking testosterone supplements Hyperglycemia: Diet controlled, last A1c 5.9. Increased cholesterol: LDL gradually increasing, recommend a healthy diet.  At  this age benefits of statins are relatively modest.  Nevertheless medications discussed with the patient.  He is not inclined to take any chol medication. BPH, LUTS last available urology noted 07/2022 was recommended to continue finasteride and consider stop tamsulosin; currently on finasteride only Thyroid disease: Follow-up elsewhere, [facility] NP, encouraged to check his thyroid every 6 weeks until WNL. CKD: rec to avoid NSAIDs. Lymphoma: Per oncology, last seen 04/18/2023 Preventive care: Rec s/p recent RSV,  flu shot, COVID booster   RTC 6 months

## 2023-09-11 ENCOUNTER — Telehealth: Admitting: Physician Assistant

## 2023-09-11 DIAGNOSIS — J069 Acute upper respiratory infection, unspecified: Secondary | ICD-10-CM

## 2023-09-11 MED ORDER — BENZONATATE 100 MG PO CAPS: 100.0000 mg | ORAL_CAPSULE | Freq: Three times a day (TID) | ORAL | 0 refills | Status: DC | PRN

## 2023-09-11 MED ORDER — PREDNISONE 10 MG (21) PO TBPK: ORAL_TABLET | ORAL | 0 refills | Status: DC

## 2023-09-11 NOTE — Progress Notes (Signed)
 I have spent 5 minutes in review of e-visit questionnaire, review and updating patient chart, medical decision making and response to patient.   Piedad Climes, PA-C

## 2023-09-11 NOTE — Progress Notes (Signed)
 E-Visit for Cough   We are sorry that you are not feeling well.  Here is how we plan to help!  Based on your presentation I believe you most likely have A cough due to a virus.  This is called viral bronchitis and is best treated by rest, plenty of fluids and control of the cough.  You may use Ibuprofen or Tylenol as directed to help your symptoms.     In addition you may use A prescription cough medication called Tessalon Perles 100mg . You may take 1-2 capsules every 8 hours as needed for your cough. I have also sent in a prednisone pack to help calm wheezing and tightness, as well as cut down on the cough further.   From your responses in the eVisit questionnaire you describe inflammation in the upper respiratory tract which is causing a significant cough.  This is commonly called Bronchitis and has four common causes:   Allergies Viral Infections Acid Reflux Bacterial Infection Allergies, viruses and acid reflux are treated by controlling symptoms or eliminating the cause. An example might be a cough caused by taking certain blood pressure medications. You stop the cough by changing the medication. Another example might be a cough caused by acid reflux. Controlling the reflux helps control the cough.  USE OF BRONCHODILATOR ("RESCUE") INHALERS: There is a risk from using your bronchodilator too frequently.  The risk is that over-reliance on a medication which only relaxes the muscles surrounding the breathing tubes can reduce the effectiveness of medications prescribed to reduce swelling and congestion of the tubes themselves.  Although you feel brief relief from the bronchodilator inhaler, your asthma may actually be worsening with the tubes becoming more swollen and filled with mucus.  This can delay other crucial treatments, such as oral steroid medications. If you need to use a bronchodilator inhaler daily, several times per day, you should discuss this with your provider.  There are probably  better treatments that could be used to keep your asthma under control.     HOME CARE Only take medications as instructed by your medical team. Complete the entire course of an antibiotic. Drink plenty of fluids and get plenty of rest. Avoid close contacts especially the very young and the elderly Cover your mouth if you cough or cough into your sleeve. Always remember to wash your hands A steam or ultrasonic humidifier can help congestion.   GET HELP RIGHT AWAY IF: You develop worsening fever. You become short of breath You cough up blood. Your symptoms persist after you have completed your treatment plan MAKE SURE YOU  Understand these instructions. Will watch your condition. Will get help right away if you are not doing well or get worse.    Thank you for choosing an e-visit.  Your e-visit answers were reviewed by a board certified advanced clinical practitioner to complete your personal care plan. Depending upon the condition, your plan could have included both over the counter or prescription medications.  Please review your pharmacy choice. Make sure the pharmacy is open so you can pick up prescription now. If there is a problem, you may contact your provider through [employer] and have the prescription routed to another pharmacy.  Your safety is important to [place]. If you have drug allergies check your prescription carefully.   For the next 24 hours you can use MyChart to ask questions about today's visit, request a non-urgent call back, or ask for a work or school excuse. You will get an  email in the next two days asking about your experience. I hope that your e-visit has been valuable and will speed your recovery.

## 2023-12-31 ENCOUNTER — Ambulatory Visit: Payer: Medicare Other | Admitting: Internal Medicine

## 2024-01-10 LAB — VITAMIN B12: Vitamin B-12: 633

## 2024-01-10 LAB — CBC: RBC: 6.06 — AB (ref 3.87–5.11)

## 2024-01-10 LAB — CBC AND DIFFERENTIAL
HCT: 52 (ref 41–53)
Hemoglobin: 16.4 (ref 13.5–17.5)
Neutrophils Absolute: 4.6
Platelets: 166 K/uL (ref 150–400)
WBC: 6.3

## 2024-01-10 LAB — VITAMIN D 25 HYDROXY (VIT D DEFICIENCY, FRACTURES): Vit D, 25-Hydroxy: 39.1

## 2024-01-10 LAB — IRON,TIBC AND FERRITIN PANEL
%SAT: 13
Ferritin: 31
Iron: 52
TIBC: 401
UIBC: 349

## 2024-01-10 LAB — LIPID PANEL
Cholesterol: 201 — AB (ref 0–200)
HDL: 50 (ref 35–70)
LDL Cholesterol: 129
Triglycerides: 123 (ref 40–160)

## 2024-01-10 LAB — HEPATIC FUNCTION PANEL
ALT: 16 U/L (ref 10–40)
AST: 22 (ref 14–40)
Alkaline Phosphatase: 83 (ref 25–125)
Bilirubin, Total: 0.6

## 2024-01-10 LAB — BASIC METABOLIC PANEL WITH GFR
BUN: 23 — AB (ref 4–21)
CO2: 26 — AB (ref 13–22)
Chloride: 100 (ref 99–108)
Creatinine: 1.8 — AB (ref 0.6–1.3)
Glucose: 104
Potassium: 4.7 meq/L (ref 3.5–5.1)
Sodium: 142 (ref 137–147)

## 2024-01-10 LAB — TSH: TSH: 2.22 (ref 0.41–5.90)

## 2024-01-10 LAB — POCT ERYTHROCYTE SEDIMENTATION RATE, NON-AUTOMATED: Sed Rate: 5

## 2024-01-10 LAB — COMPREHENSIVE METABOLIC PANEL WITH GFR
Albumin: 4.5 (ref 3.5–5.0)
Calcium: 9.7 (ref 8.7–10.7)
Globulin: 2.4
eGFR: 37

## 2024-01-10 LAB — TESTOSTERONE: Testosterone: 758

## 2024-01-10 LAB — HEMOGLOBIN A1C: Hemoglobin A1C: 5.5

## 2024-01-10 LAB — PSA: PSA: 1.6

## 2024-01-15 ENCOUNTER — Ambulatory Visit: Admitting: Internal Medicine

## 2024-01-15 ENCOUNTER — Encounter: Payer: Self-pay | Admitting: Internal Medicine

## 2024-01-15 VITALS — BP 108/68 | HR 55 | Temp 97.8°F | Resp 16 | Ht 66.5 in | Wt 154.1 lb

## 2024-01-15 DIAGNOSIS — C8510 Unspecified B-cell lymphoma, unspecified site: Secondary | ICD-10-CM | POA: Diagnosis not present

## 2024-01-15 DIAGNOSIS — E7800 Pure hypercholesterolemia, unspecified: Secondary | ICD-10-CM

## 2024-01-15 DIAGNOSIS — E039 Hypothyroidism, unspecified: Secondary | ICD-10-CM

## 2024-01-15 DIAGNOSIS — N1832 Chronic kidney disease, stage 3b: Secondary | ICD-10-CM | POA: Diagnosis not present

## 2024-01-15 DIAGNOSIS — Z7185 Encounter for immunization safety counseling: Secondary | ICD-10-CM

## 2024-01-15 NOTE — Patient Instructions (Addendum)
 Vaccines I recommended for this fall: COVID booster Flu shot  Your kidneys are not working 100%.  You need to protect them: Drink plenty of fluids. Avoid any anti-inflammatories such as naproxen or ibuprofen. Tylenol  is okay.     Check the  blood pressure regularly Blood pressure goal:  between 110/65 and  135/85. If it is consistently higher or lower, let me know   Go to the front desk for the checkout Please make an appointment for physical exam in 6 months

## 2024-01-15 NOTE — Progress Notes (Unsigned)
 Subjective:    Patient ID: Kyle Wise, male    DOB: Jul 28, 1940, 83 y.o.   MRN: [id number]  DOS:  01/15/2024 Type of visit - description: Follow-up  Chronic medical problems addressed. Labs and records from other offices reviewed. Reports she is feeling well. Essentially no LUTS. Denies chest pain or difficulty breathing No nausea or vomiting.  No diarrhea or blood in the stools.  Had a pneumonia diagnosed at urgent care in April 2025, it took few weeks but is now finally recovered  Review of Systems See above   Past Medical History:  Diagnosis Date   Allergy    seasonal   Anemia    Arthritis    Frequent UTI    History of colon polyps    Hyperlipidemia    Hypothyroidism    Left knee DJD    Legal blindness    Low grade B-cell lymphoma (HCC) 2005   low grade b-cell lymphoma   Osteoporosis     Past Surgical History:  Procedure Laterality Date   APPENDECTOMY  1999   BRAIN SURGERY  12/2008   meningioma    COLON SURGERY  1999   colon resection   CYSTOSCOPY W/ RETROGRADES Bilateral 07/27/2022   Procedure: CYSTOSCOPY WITH BILATERAL RETROGRADE PYELOGRAPHY;  Surgeon: Renda Glance, MD;  Location: [facility];  Service: Urology;  Laterality: Bilateral;  90 MINUTES NEEDED FOR CASE ANESTHESIA CAN BE GENERAL OR SPINAL   EYE SURGERY  1982   cataract and 1988   KNEE ARTHROSCOPY Left 05/2009   PARTIAL KNEE ARTHROPLASTY Left 02/03/2020   Procedure: UNICOMPARTMENTAL KNEE;  Surgeon: Josefina Chew, MD;  Location: [facility];  Service: Orthopedics;  Laterality: Left;  combo anestesia of adductor canal block and spinal   PROSTATE ABLATION  2009   microwave procedure, Dr Cherly   TONSILLECTOMY AND ADENOIDECTOMY  1945   TRANSURETHRAL RESECTION OF PROSTATE N/A 07/27/2022   Procedure: TRANSURETHRAL RESECTION OF THE PROSTATE (TURP);  Surgeon: Renda Glance, MD;  Location: [facility];  Service: Urology;  Laterality: N/A;    Current Outpatient Medications  Medication  Instructions   benzonatate  (TESSALON ) 100 mg, Oral, 3 times daily PRN   finasteride  (PROSCAR ) 5 mg, Every morning   levothyroxine  (EUTHYROX ) 50 mcg, Daily before breakfast   LIOTRIX, T3-T4, PO    Multiple Vitamin (MULTIVITAMIN WITH MINERALS) TABS tablet 1 tablet, Daily   Naltrexone  HCl (Pain) 4.5 mg, Every evening   predniSONE  (STERAPRED UNI-PAK 21 TAB) 10 MG (21) TBPK tablet Take following package directions       Objective:   Physical Exam BP 108/68   Pulse (!) 55   Temp 97.8 F (36.6 C) (Oral)   Resp 16   Ht 5' 6.5 (1.689 m)   Wt 154 lb 2 oz (69.9 kg)   SpO2 97%   BMI 24.50 kg/m  General:   Well developed, NAD, BMI noted. HEENT:  Normocephalic . Face symmetric, atraumatic Lungs:  CTA B Normal respiratory effort, no intercostal retractions, no accessory muscle use. Heart: RRR,  no murmur.  Lower extremities: no pretibial edema bilaterally  Skin: Not pale. Not jaundice Neurologic:  alert & oriented X3.  Speech normal, gait appropriate for age and unassisted Psych--  Cognition and judgment appear intact.  Cooperative with normal attention span and concentration.  Behavior appropriate. No anxious or depressed appearing.      Assessment    ASSESSMENT  Hyperglycemia High cholesterol Thyroid  disease (Used to see [facility] NP, h/o iatrogenic hyperthyroidism ) Asthma DJD,  history of knee arthroscopy 2011 Low-grade B-cell lymphoma, hematology [facility] GU: Elevated PSA, BPH, LUTS, ureteral stricture --TUMT September 2006 Dr. Excell --Recurrent UTIs due to Citrobacter despite appropriate antibiotics. Not Rx rec for asx bacteriuria --Elevated PSA, range between 3 and 7, prostate biopsy 2005, + basal cell hyperplasia without malignancy -- Prostate MRI 08/2016 with no evidence of high-grade prostate cancer. Dr Renda -- B hydronephrosis, s/p pyelogram retrograde, TURP 06-2022.  Persistent hydronephrosis noted, felt to be chronic. CKD, GFR ~40  Legally blind due to meningioma  surgery Colon polyp: s/p partial colectomy 1999, Dr Timm H/o skin cancer , sees derm  PLAN: Labs from care everywhere 10/17/2023: Creatinine 1.6, potassium 4.5, hemoglobin 15.2.  Platelet 169. Labs 01/10/2024 from [place] NP: Hemoglobin 16.4, B12 folic acid  normal, creatinine 179.  Potassium 4.7.  LFTs normal. Total cholesterol 201.  LDL 129. TSH 2.2.  PSA 1.6.  Vitamin D  normal.  CRP 2.1. A1c 5.5. Hyperglycemia: Stable, last A1c satisfactory High cholesterol: Last LDL 129, better compared to last year.  Diet controlled.   Thyroid  disease: Managed elsewhere BPH: Per urology, last PSA satisfactory. CKD: GFR remains around 40.  Recommend hydration, avoid NSAIDs. Lymphoma.  Dx 2005, LOV hematology visit 10/17/2023, no evidence of recurrence, next visit 1 year. Vaccine advised: States had a RSV Rec Flu and COVID booster this fall.   RTC 6 m CPX  .

## 2024-01-16 DIAGNOSIS — N1832 Chronic kidney disease, stage 3b: Secondary | ICD-10-CM | POA: Insufficient documentation

## 2024-01-16 NOTE — Assessment & Plan Note (Addendum)
 Labs from care everywhere 10/17/2023: Creatinine 1.6, potassium 4.5, hemoglobin 15.2.  Platelet 169. Labs 01/10/2024 from [place] NP: Hemoglobin 16.4, B12 folic acid  normal, creatinine 179.  Potassium 4.7.  LFTs normal. Total cholesterol 201.  LDL 129. TSH 2.2.  PSA 1.6.  Vitamin D  normal.  CRP 2.1. A1c 5.5. Hyperglycemia: Stable, last A1c satisfactory High cholesterol: Last LDL 129, better compared to last year.  Diet controlled.   Thyroid  disease: Managed elsewhere BPH: Per urology, last PSA satisfactory. CKD 3b: GFR remains around 40.  Recommend hydration, avoid NSAIDs. Lymphoma.  Dx 2005, LOV hematology visit 10/17/2023, no evidence of recurrence, next visit 1 year. Vaccine advised: States had a RSV Rec Flu and COVID booster this fall.   RTC 6 m CPX

## 2024-02-08 ENCOUNTER — Telehealth: Payer: Self-pay

## 2024-02-08 MED ORDER — COVID-19 MRNA VAC-TRIS(PFIZER) 30 MCG/0.3ML IM SUSY: 0.3000 mL | PREFILLED_SYRINGE | Freq: Once | INTRAMUSCULAR | 0 refills | Status: AC

## 2024-02-08 NOTE — Telephone Encounter (Signed)
 Copied from CRM #[id number]. Topic: Clinical - Request for Lab/Test Order >> Feb 08, 2024  1:55 PM Franky GRADE wrote: Reason for CRM: Patient is requesting an order for the Covid Vaccine to be sent to [facility] [address] [place], [place] [address].

## 2024-02-08 NOTE — Telephone Encounter (Signed)
 Rx sent.

## 2024-05-05 ENCOUNTER — Ambulatory Visit: Admitting: *Deleted

## 2024-05-05 VITALS — Ht 66.5 in | Wt 154.0 lb

## 2024-05-05 DIAGNOSIS — Z0000 Encounter for general adult medical examination without abnormal findings: Secondary | ICD-10-CM

## 2024-05-05 NOTE — Patient Instructions (Signed)
 Kyle Wise,  Thank you for taking the time for your Medicare Wellness Visit. I appreciate your continued commitment to your health goals. Please review the care plan we discussed, and feel free to reach out if I can assist you further.  Please note that Annual Wellness Visits do not include a physical exam. Some assessments may be limited, especially if the visit was conducted virtually. If needed, we may recommend an in-person follow-up with your provider.  GOAL: To maintain an active lifestyle  Ongoing Care Seeing your primary care provider every 3 to 6 months helps us  monitor your health and provide consistent, personalized care.   Dr Amon: 07/18/24 1pm Medicare AWV:  05/07/25 1:40pm, telephone   Recommended Screenings:  Health Maintenance  Topic Date Due   Medicare Annual Wellness Visit  04/07/2023   COVID-19 Vaccine (9 - Pfizer risk 2025-26 season) 09/10/2024   DTaP/Tdap/Td vaccine (2 - Tdap) 06/17/2028   Pneumococcal Vaccine for age over 74  Completed   Flu Shot  Completed   Zoster (Shingles) Vaccine  Completed   Meningitis B Vaccine  Aged Out   Colon Cancer Screening  Discontinued       05/01/2024    9:05 AM  Advanced Directives  Does Patient Have a Medical Advance Directive? Yes  Type of Estate Agent of [place];Living will  Does patient want to make changes to medical advance directive? No - Patient declined  Copy of Healthcare Power of Attorney in Chart? Yes - validated most recent copy scanned in chart (See row information)    Vision: Annual vision screenings are recommended for early detection of glaucoma, cataracts, and diabetic retinopathy. These exams can also reveal signs of chronic conditions such as diabetes and high blood pressure.  Dental: Annual dental screenings help detect early signs of oral cancer, gum disease, and other conditions linked to overall health, including heart disease and diabetes.  Please see the attached documents for  additional preventive care recommendations.

## 2024-05-05 NOTE — Progress Notes (Signed)
 Please attest this visit in the absence of patient primary care provider.    Chief Complaint  Patient presents with   Medicare Wellness     Subjective:   Kyle Wise is a 83 y.o. male who presents for a Medicare Annual Wellness Visit.  Visit info / Clinical Intake: Medicare Wellness Visit Type:: Subsequent Annual Wellness Visit Persons participating in visit and providing information:: patient Medicare Wellness Visit Mode:: Telephone If telephone:: video declined Since this visit was completed virtually, some vitals may be partially provided or unavailable. Missing vitals are due to the limitations of the virtual format.: Unable to obtain vitals - no equipment If Telephone or Video please confirm:: I connected with patient using audio/video enable telemedicine. I verified patient identity with two identifiers, discussed telehealth limitations, and patient agreed to proceed. Patient Location:: home Provider Location:: office Interpreter Needed?: No Pre-visit prep was completed: yes AWV questionnaire completed by patient prior to visit?: yes Date:: 04/30/24 Living arrangements:: (Patient-Rptd) lives with spouse/significant other Patient's Overall Health Status Rating: (Patient-Rptd) very good Typical amount of pain: (Patient-Rptd) none Does pain affect daily life?: (Patient-Rptd) no Are you currently prescribed opioids?: no  Dietary Habits and Nutritional Risks How many meals a day?: (Patient-Rptd) 3 Eats fruit and vegetables daily?: (Patient-Rptd) yes Most meals are obtained by: (Patient-Rptd) preparing own meals In the last 2 weeks, have you had any of the following?: none Diabetic:: no  Functional Status Activities of Daily Living (to include ambulation/medication): (Patient-Rptd) Independent Ambulation: (Patient-Rptd) Independent Medication Administration: (Patient-Rptd) Independent Home Management (perform basic housework or laundry): (Patient-Rptd) Independent Manage  your own finances?: (Patient-Rptd) yes Primary transportation is: family / friends (is legally blind) Concerns about vision?: no *vision screening is required for [facility]* (up to date with [name]) Concerns about hearing?: no Uses hearing aids?: (!) yes (wears hearing aids and working well)  Fall Screening Falls in the past year?: 1 (Had a fall in October, was coughing so bad that he passed out.) Number of falls in past year: (Patient-Rptd) 0 Was there an injury with Fall?: (Patient-Rptd) 1 Fall Risk Category Calculator: (Patient-Rptd) 2 Patient Fall Risk Level: (Patient-Rptd) Moderate Fall Risk  Fall Risk Patient at Risk for Falls Due to: History of fall(s); Orthopedic patient Fall risk Follow up: Falls evaluation completed  Home and Transportation Safety: All rugs have non-skid backing?: (Patient-Rptd) yes All stairs or steps have railings?: (Patient-Rptd) yes Grab bars in the bathtub or shower?: (!) (Patient-Rptd) no Have non-skid surface in bathtub or shower?: (Patient-Rptd) yes Good home lighting?: (Patient-Rptd) yes Regular seat belt use?: (Patient-Rptd) yes Hospital stays in the last year:: (Patient-Rptd) no  Cognitive Assessment Difficulty concentrating, remembering, or making decisions? : (Patient-Rptd) no Will 6CIT or Mini Cog be Completed: yes What year is it?: 0 points What month is it?: 0 points Give patient an address phrase to remember (5 components): [address], [place]  About what time is it?: 0 points Count backwards from 20 to 1: 0 points Say the months of the year in reverse: 2 points Repeat the address phrase from earlier: 0 points 6 CIT Score: 2 points  Advance Directives (For Healthcare) Does Patient Have a Medical Advance Directive?: Yes Does patient want to make changes to medical advance directive?: No - Patient declined Type of Advance Directive: Healthcare Power of [place]; Living will Copy of Healthcare Power of Attorney in Chart?:  Yes - validated most recent copy scanned in chart (See row information) Copy of Living Will in Chart?: Yes -  validated most recent copy scanned in chart (See row information)  Reviewed/Updated  Reviewed/Updated: Reviewed All (Medical, Surgical, Family, Medications, Allergies, Care Teams, Patient Goals)    Allergies (verified) Patient has no known allergies.   Current Medications (verified) Outpatient Encounter Medications as of 05/05/2024  Medication Sig   finasteride  (PROSCAR ) 5 MG tablet Take 5 mg by mouth in the morning.   levothyroxine  (EUTHYROX ) 50 MCG tablet Take 50 mcg by mouth daily before breakfast.   LIOTRIX, T3-T4, PO    Multiple Vitamin (MULTIVITAMIN WITH MINERALS) TABS tablet Take 1 tablet by mouth daily.   Naltrexone  HCl, Pain, 4.5 MG CAPS Take 4.5 mg by mouth every evening.   No facility-administered encounter medications on file as of 05/05/2024.    History: Past Medical History:  Diagnosis Date   Allergy    seasonal   Anemia    Arthritis    Frequent UTI    History of colon polyps    Hyperlipidemia    Hypothyroidism    Left knee DJD    Legal blindness    Low grade B-cell lymphoma (HCC) 2005   low grade b-cell lymphoma   Osteoporosis    Past Surgical History:  Procedure Laterality Date   APPENDECTOMY  1999   BRAIN SURGERY  12/2008   meningioma    COLON SURGERY  1999   colon resection   CYSTOSCOPY W/ RETROGRADES Bilateral 07/27/2022   Procedure: CYSTOSCOPY WITH BILATERAL RETROGRADE PYELOGRAPHY;  Surgeon: Renda Glance, MD;  Location: [facility];  Service: Urology;  Laterality: Bilateral;  90 MINUTES NEEDED FOR CASE ANESTHESIA CAN BE GENERAL OR SPINAL   EYE SURGERY  1982   cataract and 1988   KNEE ARTHROSCOPY Left 05/2009   PARTIAL KNEE ARTHROPLASTY Left 02/03/2020   Procedure: UNICOMPARTMENTAL KNEE;  Surgeon: Josefina Chew, MD;  Location: [facility];  Service: Orthopedics;  Laterality: Left;  combo anestesia of adductor canal block and  spinal   PROSTATE ABLATION  2009   microwave procedure, Dr Cherly   TONSILLECTOMY AND ADENOIDECTOMY  1945   TRANSURETHRAL RESECTION OF PROSTATE N/A 07/27/2022   Procedure: TRANSURETHRAL RESECTION OF THE PROSTATE (TURP);  Surgeon: Renda Glance, MD;  Location: [facility];  Service: Urology;  Laterality: N/A;   Family History  Problem Relation Age of Onset   CAD Mother        MI age 37   Arthritis Mother    Heart attack Mother    Cancer Mother    Colon cancer Father    Arthritis Father    Diabetes Maternal Grandmother    CAD Maternal Grandfather    Heart attack Maternal Grandfather    Asthma Brother    Arthritis Brother    Cancer Paternal Grandmother    Prostate cancer Neg Hx    Social History   Occupational History   Occupation: retiredclinical cytogeneticist for [employer]  Tobacco Use   Smoking status: Never   Smokeless tobacco: Never  Vaping Use   Vaping status: Never Used  Substance and Sexual Activity   Alcohol use: Yes    Comment: socially   Drug use: Never   Sexual activity: Not on file   Tobacco Counseling Counseling given: Not Answered  SDOH Screenings   Food Insecurity: No Food Insecurity (05/01/2024)  Housing: Low Risk  (05/01/2024)  Transportation Needs: No Transportation Needs (05/01/2024)  Utilities: Not At Risk (05/05/2024)  Alcohol Screen: Low Risk  (05/01/2024)  Depression (PHQ2-9): Low Risk  (05/05/2024)  Financial Resource Strain: Low Risk  (05/01/2024)  Physical Activity: Insufficiently Active (05/01/2024)  Social Connections: Socially Integrated (05/01/2024)  Stress: No Stress Concern Present (05/01/2024)  Tobacco Use: Low Risk  (05/05/2024)   See flowsheets for full screening details  Depression Screen PHQ 2 & 9 Depression Scale- Over the past 2 weeks, how often have you been bothered by any of the following problems? Little interest or pleasure in doing things: 0 Feeling down, depressed, or hopeless (PHQ Adolescent also includes...irritable): 0 PHQ-2 Total  Score: 0 Trouble falling or staying asleep, or sleeping too much: 0 Feeling tired or having little energy: 0 Poor appetite or overeating (PHQ Adolescent also includes...weight loss): 0 Feeling bad about yourself - or that you are a failure or have let yourself or your family down: 0 Trouble concentrating on things, such as reading the newspaper or watching television (PHQ Adolescent also includes...like school work): 0 Moving or speaking so slowly that other people could have noticed. Or the opposite - being so fidgety or restless that you have been moving around a lot more than usual: 0 Thoughts that you would be better off dead, or of hurting yourself in some way: 0 PHQ-9 Total Score: 0  Depression Treatment Depression Interventions/Treatment : EYV7-0 Score <4 Follow-up Not Indicated     Goals Addressed             This Visit's Progress    Patient Stated   On track    Maintain current health & activity level             Objective:    Today's Vitals   05/05/24 1326  Weight: 154 lb (69.9 kg)  Height: 5' 6.5 (1.689 m)   Body mass index is 24.48 kg/m.  Hearing/Vision screen No results found. Immunizations and Health Maintenance Health Maintenance  Topic Date Due   COVID-19 Vaccine (9 - Pfizer risk 2025-26 season) 09/10/2024   Medicare Annual Wellness (AWV)  05/05/2025   DTaP/Tdap/Td (2 - Tdap) 06/17/2028   Pneumococcal Vaccine: 50+ Years  Completed   Influenza Vaccine  Completed   Zoster Vaccines- Shingrix  Completed   Meningococcal B Vaccine  Aged Out   Colonoscopy  Discontinued        Assessment/Plan:  This is a routine wellness examination for [place].  Patient Care Team: Amon Aloysius BRAVO, MD as PCP - General (Internal Medicine) Rhoton, Clem PARAS., MD as Consulting Physician (Gastroenterology) Dasher, Lynwood, MD as Consulting Physician (Surgery) Jarold Zachary DEL., MD as Referring Physician (Hematology and Oncology) Shari Sieving, MD as Consulting Physician  (Orthopedic Surgery) Renda Glance, MD as Consulting Physician (Urology) Leslee Reusing, MD as Consulting Physician (Ophthalmology) Marlo Jayson BROCKS, MD as Consulting Physician (Dermatology) Charley Skippy HERO, NP as Nurse Practitioner (Family Medicine)  I have personally reviewed and noted the following in the patient's chart:   Medical and social history Use of alcohol, tobacco or illicit drugs  Current medications and supplements including opioid prescriptions. Functional ability and status Nutritional status Physical activity Advanced directives List of other physicians Hospitalizations, surgeries, and ER visits in previous 12 months Vitals Screenings to include cognitive, depression, and falls Referrals and appointments  No orders of the defined types were placed in this encounter.  In addition, I have reviewed and discussed with patient certain preventive protocols, quality metrics, and best practice recommendations. A written personalized care plan for preventive services as well as general preventive health recommendations were provided to patient.   Lolita Libra, CMA   05/05/2024   Return in 1 year (on 05/05/2025).  After Visit Summary: (MyChart) Due to this being a telephonic visit, the after visit summary with patients personalized plan was offered to patient via MyChart   Nurse Notes: nothing significant to report

## 2024-07-18 ENCOUNTER — Ambulatory Visit: Admitting: Internal Medicine

## 2025-05-07 ENCOUNTER — Ambulatory Visit
